# Patient Record
Sex: Female | Born: 1955
Health system: Southern US, Community
[De-identification: ages and names within clinical notes are randomized; demographics above are authoritative.]

## PROBLEM LIST (undated history)

## (undated) DIAGNOSIS — F419 Anxiety disorder, unspecified: Secondary | ICD-10-CM

## (undated) DIAGNOSIS — D649 Anemia, unspecified: Secondary | ICD-10-CM

## (undated) DIAGNOSIS — R51 Headache: Secondary | ICD-10-CM

## (undated) DIAGNOSIS — K219 Gastro-esophageal reflux disease without esophagitis: Secondary | ICD-10-CM

## (undated) DIAGNOSIS — E039 Hypothyroidism, unspecified: Secondary | ICD-10-CM

## (undated) DIAGNOSIS — F32A Depression, unspecified: Secondary | ICD-10-CM

## (undated) DIAGNOSIS — T7840XA Allergy, unspecified, initial encounter: Secondary | ICD-10-CM

## (undated) DIAGNOSIS — I499 Cardiac arrhythmia, unspecified: Secondary | ICD-10-CM

## (undated) DIAGNOSIS — F329 Major depressive disorder, single episode, unspecified: Secondary | ICD-10-CM

## (undated) DIAGNOSIS — Z8489 Family history of other specified conditions: Secondary | ICD-10-CM

## (undated) HISTORY — DX: Allergy, unspecified, initial encounter: T78.40XA

---

## 1999-11-09 ENCOUNTER — Encounter: Payer: Self-pay | Admitting: Obstetrics and Gynecology

## 1999-11-09 ENCOUNTER — Encounter: Admission: RE | Admit: 1999-11-09 | Discharge: 1999-11-09 | Payer: Self-pay | Admitting: Obstetrics and Gynecology

## 1999-12-18 ENCOUNTER — Other Ambulatory Visit: Admission: RE | Admit: 1999-12-18 | Discharge: 1999-12-18 | Payer: Self-pay | Admitting: Obstetrics and Gynecology

## 2001-10-14 ENCOUNTER — Other Ambulatory Visit: Admission: RE | Admit: 2001-10-14 | Discharge: 2001-10-14 | Payer: Self-pay | Admitting: Obstetrics and Gynecology

## 2002-01-25 ENCOUNTER — Encounter: Payer: Self-pay | Admitting: Obstetrics and Gynecology

## 2002-01-25 ENCOUNTER — Encounter: Admission: RE | Admit: 2002-01-25 | Discharge: 2002-01-25 | Payer: Self-pay | Admitting: Obstetrics and Gynecology

## 2003-07-04 ENCOUNTER — Other Ambulatory Visit: Admission: RE | Admit: 2003-07-04 | Discharge: 2003-07-04 | Payer: Self-pay | Admitting: Obstetrics and Gynecology

## 2003-07-11 ENCOUNTER — Encounter: Admission: RE | Admit: 2003-07-11 | Discharge: 2003-07-11 | Payer: Self-pay | Admitting: Obstetrics and Gynecology

## 2005-01-24 ENCOUNTER — Other Ambulatory Visit: Admission: RE | Admit: 2005-01-24 | Discharge: 2005-01-24 | Payer: Self-pay | Admitting: Obstetrics and Gynecology

## 2005-02-18 ENCOUNTER — Encounter: Admission: RE | Admit: 2005-02-18 | Discharge: 2005-02-18 | Payer: Self-pay | Admitting: Obstetrics and Gynecology

## 2006-01-16 ENCOUNTER — Other Ambulatory Visit: Admission: RE | Admit: 2006-01-16 | Discharge: 2006-01-16 | Payer: Self-pay | Admitting: Obstetrics and Gynecology

## 2006-05-20 ENCOUNTER — Encounter: Admission: RE | Admit: 2006-05-20 | Discharge: 2006-05-20 | Payer: Self-pay | Admitting: Obstetrics and Gynecology

## 2007-08-18 ENCOUNTER — Encounter: Admission: RE | Admit: 2007-08-18 | Discharge: 2007-08-18 | Payer: Self-pay | Admitting: Obstetrics and Gynecology

## 2009-03-22 ENCOUNTER — Encounter: Admission: RE | Admit: 2009-03-22 | Discharge: 2009-03-22 | Payer: Self-pay | Admitting: Obstetrics and Gynecology

## 2009-07-05 ENCOUNTER — Encounter: Payer: Self-pay | Admitting: Nurse Practitioner

## 2009-07-28 ENCOUNTER — Encounter (INDEPENDENT_AMBULATORY_CARE_PROVIDER_SITE_OTHER): Payer: Self-pay | Admitting: *Deleted

## 2009-08-07 ENCOUNTER — Ambulatory Visit: Payer: Self-pay | Admitting: Internal Medicine

## 2009-08-16 ENCOUNTER — Ambulatory Visit: Payer: Self-pay | Admitting: Internal Medicine

## 2009-08-16 HISTORY — PX: POLYPECTOMY: SHX149

## 2009-08-16 HISTORY — PX: COLONOSCOPY: SHX174

## 2009-08-18 ENCOUNTER — Encounter: Payer: Self-pay | Admitting: Internal Medicine

## 2009-08-21 ENCOUNTER — Telehealth: Payer: Self-pay | Admitting: Internal Medicine

## 2009-08-21 ENCOUNTER — Ambulatory Visit: Payer: Self-pay | Admitting: Gastroenterology

## 2009-08-21 DIAGNOSIS — R1031 Right lower quadrant pain: Secondary | ICD-10-CM | POA: Insufficient documentation

## 2009-08-21 DIAGNOSIS — Z9189 Other specified personal risk factors, not elsewhere classified: Secondary | ICD-10-CM | POA: Insufficient documentation

## 2009-08-21 DIAGNOSIS — R1084 Generalized abdominal pain: Secondary | ICD-10-CM | POA: Insufficient documentation

## 2009-08-22 ENCOUNTER — Telehealth: Payer: Self-pay | Admitting: Nurse Practitioner

## 2009-08-23 ENCOUNTER — Telehealth: Payer: Self-pay | Admitting: Nurse Practitioner

## 2009-08-25 ENCOUNTER — Telehealth: Payer: Self-pay | Admitting: Nurse Practitioner

## 2009-09-04 LAB — CONVERTED CEMR LAB
Basophils Absolute: 0 10*3/uL (ref 0.0–0.1)
Eosinophils Absolute: 0.1 10*3/uL (ref 0.0–0.7)
Lymphocytes Relative: 10 % — ABNORMAL LOW (ref 12.0–46.0)
MCHC: 34.2 g/dL (ref 30.0–36.0)
MCV: 81.7 fL (ref 78.0–100.0)
Monocytes Absolute: 0.5 10*3/uL (ref 0.1–1.0)
Neutrophils Relative %: 84.3 % — ABNORMAL HIGH (ref 43.0–77.0)
Platelets: 318 10*3/uL (ref 150.0–400.0)
RBC: 4.09 M/uL (ref 3.87–5.11)
RDW: 15.5 % — ABNORMAL HIGH (ref 11.5–14.6)

## 2009-09-06 ENCOUNTER — Ambulatory Visit: Payer: Self-pay | Admitting: Nurse Practitioner

## 2009-09-11 LAB — CONVERTED CEMR LAB
Basophils Relative: 1.1 % (ref 0.0–3.0)
Eosinophils Absolute: 0.1 10*3/uL (ref 0.0–0.7)
Ferritin: 6.2 ng/mL — ABNORMAL LOW (ref 10.0–291.0)
Folate: 7.1 ng/mL
Hemoglobin: 11.9 g/dL — ABNORMAL LOW (ref 12.0–15.0)
Iron: 60 ug/dL (ref 42–145)
Lymphocytes Relative: 20 % (ref 12.0–46.0)
MCHC: 33.8 g/dL (ref 30.0–36.0)
Monocytes Relative: 6 % (ref 3.0–12.0)
Neutro Abs: 3 10*3/uL (ref 1.4–7.7)
Neutrophils Relative %: 71.2 % (ref 43.0–77.0)
RBC: 4.3 M/uL (ref 3.87–5.11)
Transferrin: 327.1 mg/dL (ref 212.0–360.0)
WBC: 4.3 10*3/uL — ABNORMAL LOW (ref 4.5–10.5)

## 2009-10-05 ENCOUNTER — Telehealth: Payer: Self-pay | Admitting: Nurse Practitioner

## 2010-03-23 ENCOUNTER — Encounter
Admission: RE | Admit: 2010-03-23 | Discharge: 2010-03-23 | Payer: Self-pay | Source: Home / Self Care | Attending: Obstetrics and Gynecology | Admitting: Obstetrics and Gynecology

## 2010-04-22 ENCOUNTER — Encounter: Payer: Self-pay | Admitting: Obstetrics and Gynecology

## 2010-05-01 NOTE — Letter (Signed)
Summary: Patient Notice- Polyp Results  [facility]  [address] [place], [place] [address]   Phone: [phone]  Fax: [phone]        Aug 18, 2009 MRN: [id number]    Shelby Shaw [address] [place], [place]  [address]    Dear Shelby Shaw,  I am pleased to inform you that the colon polyp(s) removed during your recent colonoscopy was (were) found to be benign (no cancer detected) upon pathologic examination.  I recommend you have a repeat colonoscopy examination in 3 years to look for recurrent polyps, as having colon polyps increases your risk for having recurrent polyps or even colon cancer in the future.  Should you develop new or worsening symptoms of abdominal pain, bowel habit changes or bleeding from the rectum or bowels, please schedule an evaluation with either your primary care physician or with me.  Additional information/recommendations:  __ No further action with gastroenterology is needed at this time. Please      follow-up with your primary care physician for your other healthcare      needs.  _  Please call us if you are having persistent problems or have questions about your condition that have not been fully answered at this time.  Sincerely,  Hilarie Fredrickson MD  This letter has been electronically signed by your physician.  Appended Document: Patient Notice- Polyp Results letter mailed

## 2010-05-01 NOTE — Progress Notes (Signed)
Summary: Condition update  Phone Note Call from Patient Call back at Home Phone [phone] Call back at [phone] Cell   Caller: Patient Call For: Willette Cluster Summary of Call: Update: The pain in the right side is gone, no temp., still some pain in back Initial call taken by: Zackery Barefoot,  Aug 23, 2009 2:07 PM  Follow-up for Phone Call        The pt has no fever today, and no abd pain but does still have some slight pain in the rt back area.  I asked if she is taking the Hydrocodone and she is taking 1/2 the dose .  It seems to give her a headache.  I told her to try some Extrastrength Tylenol and I asked her to call me on Friday with a progress report on her back pain.  She thanked [place] for being so nice and caring. Follow-up by: Joselyn Glassman,  Aug 23, 2009 4:05 PM

## 2010-05-01 NOTE — Assessment & Plan Note (Signed)
Summary: abd. pain post colon-Dr.Perry pt./cl   History of Present Illness Visit Type: Initial Visit Primary GI MD: Yancey Flemings MD Primary Provider: Huel Cote, MD Chief Complaint: abdominal pain start 08/18/09 History of Present Illness:   Patient is a 55 year old female who had a screening colonoscopy with removal of a 15mm pedunculated rectal polyp via snare and monopolar cautery by Dr. Marina Goodell on 08/16/09.  Two days later she developed mid lower and RLQ pain radiating around to back. With the onset of pain she took a Tylenol #3 (which she keeps on hand for migraines), and applied a heating pad. Low grade fever yesterday and today but pain is improving (without pain medications).  No rectal bleeding.     GI Review of Systems    Reports abdominal pain.     Location of  Abdominal pain: lower abdomen.    Denies acid reflux, belching, bloating, chest pain, dysphagia with liquids, dysphagia with solids, heartburn, loss of appetite, nausea, vomiting, vomiting blood, weight loss, and  weight gain.        Denies anal fissure, black tarry stools, change in bowel habit, constipation, diarrhea, diverticulosis, fecal incontinence, heme positive stool, hemorrhoids, irritable bowel syndrome, jaundice, light color stool, liver problems, rectal bleeding, and  rectal pain. Preventive Screening-Counseling & Management  Alcohol-Tobacco     Smoking Status: never      Drug Use:  no.     Current Medications (verified): 1)  Sertraline Hcl 100 Mg Tabs (Sertraline Hcl) .... Once Daily 2)  Junel 1.5/30 1.5-30 Mg-Mcg Tabs (Norethindrone Acet-Ethinyl Est) .... Take As Directed 3)  Lunesta 3 Mg Tabs (Eszopiclone) .... At Bedtime 4)  Vitamin D 50000iu .... Take 1 Tablet Once Weekly 5)  Maxalt 10 Mg Tabs (Rizatriptan Benzoate) .... As Needed For Migraines 6)  Clonazepam 1 Mg Tabs (Clonazepam) .... Once Daily  Allergies (verified): No Known Drug Allergies  Past History:  Past Medical History: Anxiety  Disorder Chronic Headaches Depression GERD Colon polyp (tubulovillous) 5/11  Past Surgical History: Tonsillectomy  Family History: No FH of Colon Cancer: Family History of Colon Polyps:Father Family History of Heart Disease:Father  Rheumatoid Arthritis: Mother  Social History: Occupation: n/a Patient has never smoked.  Alcohol Use - no Daily Caffeine Use Illicit Drug Use - no Smoking Status:  never Drug Use:  no  Review of Systems       The patient complains of fever.  The patient denies allergy/sinus, anemia, anxiety-new, arthritis/joint pain, back pain, blood in urine, breast changes/lumps, change in vision, confusion, cough, coughing up blood, depression-new, fainting, fatigue, headaches-new, hearing problems, heart murmur, heart rhythm changes, itching, menstrual pain, muscle pains/cramps, night sweats, nosebleeds, pregnancy symptoms, shortness of breath, skin rash, sleeping problems, sore throat, swelling of feet/legs, swollen lymph glands, thirst - excessive , urination - excessive , urination changes/pain, urine leakage, vision changes, and voice change.    Vital Signs:  Patient profile:   55 year old female Height:      62.5 inches Weight:      154 pounds BMI:     27.82 Temp:     99.1 degrees F oral Pulse rate:   120 / minute Pulse rhythm:   regular BP sitting:   144 / 86  (left arm) Cuff size:   regular  Vitals Entered By: June McMurray CMA Duncan Dull) (Aug 21, 2009 2:13 PM)  Physical Exam  General:  Well developed, well nourished, no acute distress. Eyes:  Conjunctiva pink, no icterus.  Mouth:  No  oral lesions. Tongue moist.  Neck:  no obvious masses  Lungs:  Clear throughout to auscultation. Heart:  Tachycardiac at 120 Abdomen:  Abdomen soft, nondistended, mild mid lower abdominal tenderness. No obvious masses or hepatomegaly. Normal bowel sounds. Soft upper mid abdominal bruit.  Neurologic:  Alert and  oriented x4;  grossly normal neurologically. Psych:   Anxious, teary- eyed.   Impression & Recommendations:  Problem # 1:  RLQ PAIN (ICD-789.03) Assessment New Probably post-polypectomy syndrome with fever (low grade) and abdominal pain. Pain is improving, mild tenderness on exam. Clear liquid diet for next 2-3 days. Will obtain CBC, give course of Cipro and Flagyl and limited amount of Vicodin to take if needed.  Will call patient tomorrow for condition update.  Orders: TLB-CBC Platelet - w/Differential (85025-[id number])  Patient Instructions: 1)  We have sent two perscriptions for antibiotics to [facility]. [address] [employer], [employer]. 2)  We faxed a perscription for Hydrocodone to our pharmacy.  3)  Clear liquid brochure given, stay on clear liquids for 2-3 days. 4)  Please call us tomorrow and let us know how she is doing and if your are not feeling better we may order a CT scan. 5)  Copy sent to : Huel Cote, MD 6)  The medication list was reviewed and reconciled.  All changed / newly prescribed medications were explained.  A complete medication list was provided to the patient / caregiver.  Prescriptions: HYDROCODONE-ACETAMINOPHEN 5-325 MG TABS (HYDROCODONE-ACETAMINOPHEN) Take 1 tab every 6 hours as needed for pain  #20 x 0   Entered by:   Lowry Ram NCMA   Authorized by:   Willette Cluster NP   Signed by:   Lowry Ram NCMA on 08/21/2009   Method used:   Printed then faxed to ...       [facility]  [address] [id number] (retail)       [address]       [place], [place]  [address]       Ph: [phone] or [phone]       Fax: [phone]   RxID:   [id number] FLAGYL 500 MG TABS (METRONIDAZOLE) Take 1 tab 3 times daily x 7 days  #21 x 0   Entered by:   Lowry Ram NCMA   Authorized by:   Willette Cluster NP   Signed by:   Lowry Ram NCMA on 08/21/2009   Method used:   Electronically to        [facility]  [address] [id number] (retail)       [address]       [place], [place]  [address]       Ph: [phone] or [phone]       Fax: [phone]    RxID:   [id number] CIPRO 500 MG TABS (CIPROFLOXACIN HCL) Take 1 tab 3 times daily  #21 x 0   Entered by:   Lowry Ram NCMA   Authorized by:   Willette Cluster NP   Signed by:   Lowry Ram NCMA on 08/21/2009   Method used:   [facility]ronically to        [facility]  [address] [id number] ([facility]ail)       [address]       [place], [place]  [address]       Ph: [phone] or [phone]       Fax: [phone]   RxID:   [id number]   Appended Document: abd. pain post colon-Dr.Perry pt./cl I doubt that this is post polypectomy syndrome  based on the timing and location of the polypectomy. If her pain continues, consider urinalysis and CT scan.

## 2010-05-01 NOTE — Progress Notes (Signed)
Summary: speak to [facility]  Phone Note Call from Patient Call back at [phone]   Caller: Patient Call For: Paula// Marina Goodell Reason for Call: Talk to Nurse Summary of Call: Patient wants to speak to [facility] regarding an appt that was set up for her. Initial call taken by: Tawni Levy,  October 05, 2009 3:44 PM  Follow-up for Phone Call        Pt changed her appt with Dr. Marina Goodell for the office from 10-11-09 to 10-30-09 at 3:45PM.  Follow-up by: Joselyn Glassman,  October 05, 2009 4:33 PM

## 2010-05-01 NOTE — Progress Notes (Signed)
Summary: Condtion update  Phone Note Call from Patient Call back at Home Phone [phone] Call back at [phone]   Caller: Patient Call For: Willette Cluster Reason for Call: Talk to Nurse Summary of Call: Update:  Pt is feeling better....no pain or fever just a little nauseated Initial call taken by: Karna Christmas,  Aug 25, 2009 2:47 PM  Follow-up for Phone Call        The pt is doing much better.  She is having no pain, nausea or fever. She wanted to thank [place] for checking on her. Follow-up by: Joselyn Glassman,  August 30, 2009 11:26 AM  Additional Follow-up for Phone Call Additional follow up Details #1::        great Additional Follow-up by: Willette Cluster NP,  September 02, 2009 10:01 AM

## 2010-05-01 NOTE — Miscellaneous (Signed)
Summary: [facility] Previsit/prep  Clinical Lists Changes  Medications: Added new medication of MOVIPREP 100 GM  SOLR (PEG-KCL-NACL-NASULF-NA ASC-C) As per prep instructions. - Signed Rx of MOVIPREP 100 GM  SOLR (PEG-KCL-NACL-NASULF-NA ASC-C) As per prep instructions.;  #1 x 0;  Signed;  Entered by: Wyona Almas RN;  Authorized by: Hilarie Fredrickson MD;  Method used: Electronically to [facility]  [address] [id number] [address], [place], [place]  [address], Ph: [phone] or [phone], Fax: [phone] Observations: Added new observation of NKA: T (08/07/2009 15:48)    Prescriptions: MOVIPREP 100 GM  SOLR (PEG-KCL-NACL-NASULF-NA ASC-C) As per prep instructions.  #1 x 0   Entered by:   Wyona Almas RN   Authorized by:   Hilarie Fredrickson MD   Signed by:   Wyona Almas RN on 08/07/2009   Method used:   [facility]ronically [facility]        [facility]  [address] [id number]       [address]       [place], [place]  [address]       Ph: [phone] or [phone]       Fax: [phone]   RxID:   [id number]

## 2010-05-01 NOTE — Procedures (Signed)
Summary: Colonoscopy  Patient: [name] Note: All result statuses are Final unless otherwise noted.  Tests: (1) Colonoscopy (COL)   COL Colonoscopy           DONE     [facility]     [address]     [place], [place]  [address]           COLONOSCOPY PROCEDURE REPORT           PATIENT:  Shelby Shaw, Shelby Shaw  MR#:  [id number]     BIRTHDATE:  June 19, 1955, 54 yrs. old  GENDER:  female     ENDOSCOPIST:  Wilhemina Bonito. Eda Keys, MD     REF. BY:  Huel Cote, M.D.     PROCEDURE DATE:  08/16/2009     PROCEDURE:  Colonoscopy with snare polypectomy x 1     ASA CLASS:  Class II     INDICATIONS:  Routine Risk Screening     MEDICATIONS:   Fentanyl 100 mcg IV, Versed 10 mg IV, Benadryl 50     mg IV           DESCRIPTION OF PROCEDURE:   After the risks benefits and     alternatives of the procedure were thoroughly explained, informed     consent was obtained.  Digital rectal exam was performed and     revealed no abnormalities.   The LB [id number] endoscope     was introduced through the anus and advanced to the cecum, which     was identified by both the appendix and ileocecal valve, without     limitations.Time to cecum = 4:35 min. The quality of the prep was     excellent, using MoviPrep.  The instrument was then slowly     withdrawn (11:18 min.) as the colon was fully examined.     <<PROCEDUREIMAGES>>           FINDINGS:  A 15mm pedunculated polyp was found in the rectum.     Polyp was snared, then cauterized with monopolar cautery.     Retrieval was successful.   This was otherwise a normal     examination of the colon.   Retroflexed views in the rectum     revealed no abnormalities.    The scope was then withdrawn from     the patient and the procedure completed.           COMPLICATIONS:  None     ENDOSCOPIC IMPRESSION:     1) Pedunculated polyp in the rectum - removed     2) Otherwise normal examination           RECOMMENDATIONS:     1) Follow up colonoscopy in 3  years           ______________________________     Wilhemina Bonito. Eda Keys, MD           CC:  Geoffry Paradise, MD;Kathy Senaida Ores, MD;The Patient           n.     Rosalie DoctorWilhemina Bonito. Eda Keys at 08/16/2009 02:08 PM           Heron Sabins, [phone]  Note: An exclamation mark (!) indicates a result that was not dispersed into the flowsheet. Document Creation Date: 08/17/2009 5:07 PM _______________________________________________________________________  (1) Order result status: Final Collection or observation date-time: 08/16/2009 14:03 Requested date-time:  Receipt date-time:  Reported date-time:  Referring Physician:   Ordering Physician: Yancey Flemings  Montez Hageman [phone]) Specimen Source:  Source: Launa Grill Order Number: [id number] Lab site:   Appended Document: Colonoscopy recall     Procedures Next Due Date:    Colonoscopy: 07/2012

## 2010-05-01 NOTE — Progress Notes (Signed)
Summary: Triage / pain  Phone Note Call from Patient Call back at [phone]   Caller: Patient Call For: Dr. Marina Goodell Reason for Call: Talk to Nurse Summary of Call: Pt had a colonoscopy last week. Is having discomfort in lower right abd. area wrapping around to back. Initial call taken by: Karna Christmas,  Aug 21, 2009 8:40 AM  Follow-up for Phone Call        Had colon Wed.5/18 rectal polyp only removed on Friday night-08/18/09  at about 11 p.m she began having abd. in middle and lower right quadrants which wasalmost constant thru the night and pain level was a 9 so she used her Tylenol#3 which she takes for migraines and heating pad and was able to sleep.Since then in am it is better but then starts up again.Is having soft formed brown  b.m's and has not seen any blood.Temp. at h.s. last night was 99.6 but is down this am. No nausea or vomiting. Follow-up by: Teryl Lucy RN,  Aug 21, 2009 9:00 AM  Additional Follow-up for Phone Call Additional follow up Details #1::        not sure why she is having pain. would think it unlikely related to procedure based on hx. She should be seen by someone in the office today Additional Follow-up by: Hilarie Fredrickson MD,  Aug 21, 2009 9:24 AM    Additional Follow-up for Phone Call Additional follow up Details #2::    Given appt. with N.P.for today. Follow-up by: Teryl Lucy RN,  Aug 21, 2009 9:59 AM

## 2010-05-01 NOTE — Progress Notes (Signed)
  Phone Note Outgoing Call   Call placed by: Willette Cluster, NP-C Call placed to: Patient Summary of Call: Called patient for condition update. She is feeling better today than yesterday. Temp 99.0. She will call tomorrow for another condition update.    C

## 2010-05-01 NOTE — Letter (Signed)
Summary: [facility] Instructions  [facility]  [address] [place], [place] [address]   Phone: [phone]  Fax: [phone]       Shelby Shaw    December 11, 1955    MRN: [id number]        Procedure Day Dorna Bloom:  [facility]  08/16/09     Arrival Time:  12:30PM     Procedure Time:  1:30PM     Location of Procedure:                    _X _  [facility] (4th Floor)                        PREPARATION FOR COLONOSCOPY WITH MOVIPREP   Starting 5 days prior to your procedure 08/11/09 do not eat nuts, seeds, popcorn, corn, beans, peas,  salads, or any raw vegetables.  Do not take any fiber supplements (e.g. Metamucil, Citrucel, and Benefiber).  THE DAY BEFORE YOUR PROCEDURE         DATE: 08/15/09  DAY: TUESDAY  1.  Drink clear liquids the entire day-NO SOLID FOOD  2.  Do not drink anything colored red or purple.  Avoid juices with pulp.  No orange juice.  3.  Drink at least 64 oz. (8 glasses) of fluid/clear liquids during the day to prevent dehydration and help the prep work efficiently.  CLEAR LIQUIDS INCLUDE: Water Jello Ice Popsicles Tea (sugar ok, no milk/cream) Powdered fruit flavored drinks Coffee (sugar ok, no milk/cream) Gatorade Juice: apple, white grape, white cranberry  Lemonade Clear bullion, consomm, broth Carbonated beverages (any kind) Strained chicken noodle soup Hard Candy                             4.  In the morning, mix first dose of MoviPrep solution:    Empty 1 Pouch A and 1 Pouch B into the disposable container    Add lukewarm drinking water to the top line of the container. Mix to dissolve    Refrigerate (mixed solution should be used within 24 hrs)  5.  Begin drinking the prep at 5:00 p.m. The MoviPrep container is divided by 4 marks.   Every 15 minutes drink the solution down to the next mark (approximately 8 oz) until the full liter is complete.   6.  Follow completed prep with 16 oz of clear liquid of your choice  (Nothing red or purple).  Continue to drink clear liquids until bedtime.  7.  Before going to bed, mix second dose of MoviPrep solution:    Empty 1 Pouch A and 1 Pouch B into the disposable container    Add lukewarm drinking water to the top line of the container. Mix to dissolve    Refrigerate  THE DAY OF YOUR PROCEDURE      DATE: 08/16/09  DAY: WEDNESDAY  Beginning at 8:30AM (5 hours before procedure):         1. Every 15 minutes, drink the solution down to the next mark (approx 8 oz) until the full liter is complete.  2. Follow completed prep with 16 oz. of clear liquid of your choice.    3. You may drink clear liquids until 11:30AM (2 HOURS BEFORE PROCEDURE).   MEDICATION INSTRUCTIONS  Unless otherwise instructed, you should take regular prescription medications with a small sip of water   as early as possible the morning  of your procedure.        OTHER INSTRUCTIONS  You will need a responsible adult at least 55 years of age to accompany you and drive you home.   This person must remain in the waiting room during your procedure.  Wear loose fitting clothing that is easily removed.  Leave jewelry and other valuables at home.  However, you may wish to bring a book to read or  an iPod/MP3 player to listen to music as you wait for your procedure to start.  Remove all body piercing jewelry and leave at home.  Total time from sign-in until discharge is approximately 2-3 hours.  You should go home directly after your procedure and rest.  You can resume normal activities the  day after your procedure.  The day of your procedure you should not:   Drive   Make legal decisions   Operate machinery   Drink alcohol   Return to work  You will receive specific instructions about eating, activities and medications before you leave.    The above instructions have been reviewed and explained to me by   Wyona Almas RN  Aug 07, 2009 4:45 PM     I fully understand  and can verbalize these instructions _____________________________ Date _________

## 2012-07-13 ENCOUNTER — Encounter: Payer: Self-pay | Admitting: Internal Medicine

## 2013-02-09 ENCOUNTER — Other Ambulatory Visit: Payer: Self-pay

## 2013-02-09 DIAGNOSIS — Z1231 Encounter for screening mammogram for malignant neoplasm of breast: Secondary | ICD-10-CM

## 2013-03-16 ENCOUNTER — Ambulatory Visit: Payer: Self-pay

## 2013-03-16 ENCOUNTER — Ambulatory Visit
Admission: RE | Admit: 2013-03-16 | Discharge: 2013-03-16 | Disposition: A | Discharge status: Home / Self Care | Payer: BC Managed Care – PPO | Source: Ambulatory Visit

## 2013-03-16 DIAGNOSIS — Z1231 Encounter for screening mammogram for malignant neoplasm of breast: Secondary | ICD-10-CM

## 2013-08-31 ENCOUNTER — Encounter (HOSPITAL_COMMUNITY): Payer: Self-pay | Admitting: Pharmacy Technician

## 2013-09-07 NOTE — Patient Instructions (Addendum)
Your procedure is scheduled on: Thursday, September 09, 2013  Enter through the [employer] [facility] at: 7:30am  Pick up the phone at the desk and dial [phone].  Call this number if you have problems the morning of surgery: [phone].  Remember: Do NOT eat food: After midnight tonight Do NOT drink clear liquids after: After midnight tonight Take these medicines the morning of surgery with a SIP OF WATER: Omeprazole, Levothyroxine, Clonazepam if needed for anxiety  Do NOT wear jewelry (body piercing), metal hair clips/bobby pins, make-up, or nail polish. Do NOT wear lotions, powders, or perfumes.  You may wear deoderant. Do NOT shave for 48 hours prior to surgery. Do NOT bring valuables to the hospital. Contacts, dentures, or bridgework may not be worn into surgery. Leave suitcase in car.  After surgery it may be brought to your room.  For patients admitted to the hospital, checkout time is 11:00 AM the day of discharge.

## 2013-09-08 ENCOUNTER — Encounter (HOSPITAL_COMMUNITY): Payer: Self-pay

## 2013-09-08 ENCOUNTER — Encounter: Payer: Self-pay | Admitting: Internal Medicine

## 2013-09-08 ENCOUNTER — Encounter (HOSPITAL_COMMUNITY)
Admission: RE | Admit: 2013-09-08 | Discharge: 2013-09-08 | Disposition: A | Discharge status: Home / Self Care | Payer: BC Managed Care – PPO | Source: Ambulatory Visit | Attending: Obstetrics and Gynecology | Admitting: Obstetrics and Gynecology

## 2013-09-08 HISTORY — DX: Gastro-esophageal reflux disease without esophagitis: K21.9

## 2013-09-08 HISTORY — DX: Depression, unspecified: F32.A

## 2013-09-08 HISTORY — DX: Headache: R51

## 2013-09-08 HISTORY — DX: Anemia, unspecified: D64.9

## 2013-09-08 HISTORY — DX: Cardiac arrhythmia, unspecified: I49.9

## 2013-09-08 HISTORY — DX: Major depressive disorder, single episode, unspecified: F32.9

## 2013-09-08 HISTORY — DX: Family history of other specified conditions: Z84.89

## 2013-09-08 HISTORY — DX: Anxiety disorder, unspecified: F41.9

## 2013-09-08 HISTORY — DX: Hypothyroidism, unspecified: E03.9

## 2013-09-08 LAB — CBC
HCT: 41 % (ref 36.0–46.0)
HEMOGLOBIN: 13.5 g/dL (ref 12.0–15.0)
MCH: 29.1 pg (ref 26.0–34.0)
MCHC: 32.9 g/dL (ref 30.0–36.0)
MCV: 88.4 fL (ref 78.0–100.0)
Platelets: 321 10*3/uL (ref 150–400)
RBC: 4.64 MIL/uL (ref 3.87–5.11)
RDW: 12.8 % (ref 11.5–15.5)
WBC: 6 10*3/uL (ref 4.0–10.5)

## 2013-09-08 LAB — ABO/RH: ABO/RH(D): B POS

## 2013-09-08 LAB — BASIC METABOLIC PANEL
BUN: 9 mg/dL (ref 6–23)
CO2: 25 mEq/L (ref 19–32)
Calcium: 10.2 mg/dL (ref 8.4–10.5)
Chloride: 102 mEq/L (ref 96–112)
Creatinine, Ser: 0.67 mg/dL (ref 0.50–1.10)
GLUCOSE: 105 mg/dL — AB (ref 70–99)
POTASSIUM: 4.5 meq/L (ref 3.7–5.3)
SODIUM: 140 meq/L (ref 137–147)

## 2013-09-08 NOTE — H&P (Signed)
Shelby Shaw is an 58 y.o. female G2P2 who presents for scheduled LAVH/BSO for abnormal uterine bleeding and pelvic pain requiring narcotic relief.  The patient has a long h/o heavy cycles and was managed with OCP's for many years.  As she is into a menopausal age, she was switched from OCP's to cyclic HRT and has done poorly for 6 onths despite multiple adjustments to her regimen.  Her bleeding will alternate from constant spotting to flooding and severe cramping pain requiring narcotic relief. EMB was benign and US showed possible adenomyosis.  She also has significant headaches and depression with her hormone changes and wishes to come off them altogether.  Pertinent Gynecological History: Last pap: normal Date: 3/15 OB History:NSVD x 2   Menstrual History:  No LMP recorded.    Past Medical History  Diagnosis Date  . Hypothyroidism   . Dysrhythmia     "skipping beat" seems better since started Thyroid medication  . Anxiety   . Depression   . GERD (gastroesophageal reflux disease)   . Headache(784.0)     migraines  . Anemia   . Family history of anesthesia complication     mother has PONV this is patients first surgery    No past surgical history on file.  No family history on file.  Social History:  reports that she has never smoked. She has never used smokeless tobacco. She reports that she drinks alcohol. She reports that she does not use illicit drugs.  Allergies: No Known Allergies  No prescriptions prior to admission    ROS  There were no vitals taken for this visit. Physical Exam  Constitutional: She is oriented to person, place, and time. She appears well-developed and well-nourished.  Cardiovascular: Normal rate and regular rhythm.   Respiratory: Effort normal.  GI: Soft.  Genitourinary: Vagina normal and uterus normal.  Neurological: She is alert and oriented to person, place, and time.  Psychiatric: She has a normal mood and affect.    Results for orders  placed during the hospital encounter of 09/08/13 (from the past 24 hour(s))  ABO/RH     Status: None   Collection Time    09/08/13 11:55 AM      Result Value Ref Range   ABO/RH(D) B POS    BASIC METABOLIC PANEL     Status: Abnormal   Collection Time    09/08/13 11:55 AM      Result Value Ref Range   Sodium 140  137 - 147 mEq/L   Potassium 4.5  3.7 - 5.3 mEq/L   Chloride 102  96 - 112 mEq/L   CO2 25  19 - 32 mEq/L   Glucose, Bld 105 (*) 70 - 99 mg/dL   BUN 9  6 - 23 mg/dL   Creatinine, Ser 0.67  0.50 - 1.10 mg/dL   Calcium 10.2  8.4 - 10.5 mg/dL   GFR calc non Af Amer >90  >90 mL/min   GFR calc Af Amer >90  >90 mL/min  CBC     Status: None   Collection Time    09/08/13 11:55 AM      Result Value Ref Range   WBC 6.0  4.0 - 10.5 K/uL   RBC 4.64  3.87 - 5.11 MIL/uL   Hemoglobin 13.5  12.0 - 15.0 g/dL   HCT 41.0  36.0 - 46.0 %   MCV 88.4  78.0 - 100.0 fL   MCH 29.1  26.0 - 34.0 pg   MCHC 32.9  30.0 - 36.0 g/dL   RDW 12.8  11.5 - 15.5 %   Platelets 321  150 - 400 K/uL    No results found.  Assessment/Plan: The patient was counseled regarding the risks of laparoscopic assisted vaginal hysterectomy, The procedure was reviewed in detail and expectations regarding recovery. Risks of bleeding, infection and possible damage to bowel and bladder were reviewed. The patient understands that should a complication arise she would likely need a larger abdominal incision and that this would delay her recovery. She would accept a blood transfusion if needed. We also discussed removal of the fallopian tubes as a means of possibly reducing future risk of ovarian cancer and she is agreeable to this. She would also like her ovaries removed. We discussed that this may make hormonal symptoms worse. She is ready to proceed.    Logan Bores 09/08/2013, 10:14 PM

## 2013-09-09 ENCOUNTER — Encounter (HOSPITAL_COMMUNITY): Payer: BC Managed Care – PPO | Admitting: Certified Registered"

## 2013-09-09 ENCOUNTER — Ambulatory Visit (HOSPITAL_COMMUNITY): Payer: BC Managed Care – PPO | Admitting: Certified Registered"

## 2013-09-09 ENCOUNTER — Encounter (HOSPITAL_COMMUNITY): Payer: Self-pay

## 2013-09-09 ENCOUNTER — Encounter (HOSPITAL_COMMUNITY)
Admission: RE | Disposition: A | Discharge status: Home / Self Care | Payer: Self-pay | Source: Ambulatory Visit | Attending: Obstetrics and Gynecology

## 2013-09-09 ENCOUNTER — Ambulatory Visit (HOSPITAL_COMMUNITY)
Admission: RE | Admit: 2013-09-09 | Discharge: 2013-09-10 | Disposition: A | Discharge status: Home / Self Care | Payer: BC Managed Care – PPO | Source: Ambulatory Visit | Attending: Obstetrics and Gynecology | Admitting: Obstetrics and Gynecology

## 2013-09-09 DIAGNOSIS — K219 Gastro-esophageal reflux disease without esophagitis: Secondary | ICD-10-CM | POA: Insufficient documentation

## 2013-09-09 DIAGNOSIS — Z90710 Acquired absence of both cervix and uterus: Secondary | ICD-10-CM | POA: Diagnosis present

## 2013-09-09 DIAGNOSIS — Z79890 Hormone replacement therapy (postmenopausal): Secondary | ICD-10-CM | POA: Insufficient documentation

## 2013-09-09 DIAGNOSIS — N938 Other specified abnormal uterine and vaginal bleeding: Secondary | ICD-10-CM | POA: Insufficient documentation

## 2013-09-09 DIAGNOSIS — F341 Dysthymic disorder: Secondary | ICD-10-CM | POA: Insufficient documentation

## 2013-09-09 DIAGNOSIS — N8000 Endometriosis of the uterus, unspecified: Secondary | ICD-10-CM | POA: Insufficient documentation

## 2013-09-09 DIAGNOSIS — D649 Anemia, unspecified: Secondary | ICD-10-CM | POA: Insufficient documentation

## 2013-09-09 DIAGNOSIS — D251 Intramural leiomyoma of uterus: Secondary | ICD-10-CM | POA: Insufficient documentation

## 2013-09-09 DIAGNOSIS — E039 Hypothyroidism, unspecified: Secondary | ICD-10-CM | POA: Insufficient documentation

## 2013-09-09 DIAGNOSIS — N949 Unspecified condition associated with female genital organs and menstrual cycle: Secondary | ICD-10-CM | POA: Insufficient documentation

## 2013-09-09 HISTORY — PX: LAPAROSCOPIC ASSISTED VAGINAL HYSTERECTOMY: SHX5398

## 2013-09-09 LAB — PREGNANCY, URINE: Preg Test, Ur: NEGATIVE

## 2013-09-09 SURGERY — HYSTERECTOMY, VAGINAL, LAPAROSCOPY-ASSISTED
Anesthesia: General | Site: Abdomen | Laterality: Bilateral

## 2013-09-09 MED ORDER — SODIUM CHLORIDE 0.9 % IJ SOLN
INTRAMUSCULAR | Status: AC
  Filled 2013-09-09: qty 10

## 2013-09-09 MED ORDER — IBUPROFEN 600 MG PO TABS: 600.0000 mg | ORAL_TABLET | Freq: Four times a day (QID) | ORAL | Status: DC | PRN

## 2013-09-09 MED ORDER — ONDANSETRON HCL 4 MG/2ML IJ SOLN: 4.0000 mg | Freq: Four times a day (QID) | INTRAMUSCULAR | Status: DC | PRN

## 2013-09-09 MED ORDER — ROCURONIUM BROMIDE 100 MG/10ML IV SOLN
INTRAVENOUS | Status: AC
Start: 2013-09-09 — End: 2013-09-09
  Filled 2013-09-09: qty 1

## 2013-09-09 MED ORDER — GLYCOPYRROLATE 0.2 MG/ML IJ SOLN
INTRAMUSCULAR | Status: AC
  Filled 2013-09-09: qty 1

## 2013-09-09 MED ORDER — NEOSTIGMINE METHYLSULFATE 10 MG/10ML IV SOLN
INTRAVENOUS | Status: AC
  Filled 2013-09-09: qty 1

## 2013-09-09 MED ORDER — NEOSTIGMINE METHYLSULFATE 10 MG/10ML IV SOLN
INTRAVENOUS | Status: DC | PRN
  Administered 2013-09-09: 1 mg via INTRAVENOUS
  Administered 2013-09-09: 3 mg via INTRAVENOUS

## 2013-09-09 MED ORDER — CLONAZEPAM 0.5 MG PO TABS: 1.0000 mg | ORAL_TABLET | Freq: Every day | ORAL | Status: DC | PRN

## 2013-09-09 MED ORDER — SODIUM CHLORIDE 0.9 % IJ SOLN
INTRAMUSCULAR | Status: AC
  Filled 2013-09-09: qty 50

## 2013-09-09 MED ORDER — MENTHOL 3 MG MT LOZG: 1.0000 | LOZENGE | OROMUCOSAL | Status: DC | PRN

## 2013-09-09 MED ORDER — DIPHENHYDRAMINE HCL 12.5 MG/5ML PO ELIX: 12.5000 mg | ORAL_SOLUTION | Freq: Four times a day (QID) | ORAL | Status: DC | PRN

## 2013-09-09 MED ORDER — KETOROLAC TROMETHAMINE 30 MG/ML IJ SOLN
INTRAMUSCULAR | Status: DC | PRN
  Administered 2013-09-09: 30 mg via INTRAVENOUS

## 2013-09-09 MED ORDER — HEPARIN SODIUM (PORCINE) 5000 UNIT/ML IJ SOLN
INTRAMUSCULAR | Status: AC
  Filled 2013-09-09: qty 1

## 2013-09-09 MED ORDER — GLYCOPYRROLATE 0.2 MG/ML IJ SOLN
INTRAMUSCULAR | Status: DC | PRN
  Administered 2013-09-09: .6 mg via INTRAVENOUS
  Administered 2013-09-09: 0.2 mg via INTRAVENOUS

## 2013-09-09 MED ORDER — MIDAZOLAM HCL 2 MG/2ML IJ SOLN
INTRAMUSCULAR | Status: AC
  Filled 2013-09-09: qty 2

## 2013-09-09 MED ORDER — CEFAZOLIN SODIUM-DEXTROSE 2-3 GM-% IV SOLR
2.0000 g | INTRAVENOUS | Status: AC
  Administered 2013-09-09: 2 g via INTRAVENOUS

## 2013-09-09 MED ORDER — DOCUSATE SODIUM 100 MG PO CAPS
100.0000 mg | ORAL_CAPSULE | Freq: Two times a day (BID) | ORAL | Status: DC
  Administered 2013-09-09 – 2013-09-10 (×2): 100 mg via ORAL
  Filled 2013-09-09 (×2): qty 1

## 2013-09-09 MED ORDER — HYDROMORPHONE HCL PF 1 MG/ML IJ SOLN
INTRAMUSCULAR | Status: DC | PRN
  Administered 2013-09-09: 1 mg via INTRAVENOUS

## 2013-09-09 MED ORDER — ROCURONIUM BROMIDE 100 MG/10ML IV SOLN
INTRAVENOUS | Status: DC | PRN
  Administered 2013-09-09: 35 mg via INTRAVENOUS
  Administered 2013-09-09: 5 mg via INTRAVENOUS

## 2013-09-09 MED ORDER — LIDOCAINE HCL (CARDIAC) 20 MG/ML IV SOLN
INTRAVENOUS | Status: AC
  Filled 2013-09-09: qty 5

## 2013-09-09 MED ORDER — DIPHENHYDRAMINE HCL 50 MG/ML IJ SOLN: 12.5000 mg | Freq: Four times a day (QID) | INTRAMUSCULAR | Status: DC | PRN

## 2013-09-09 MED ORDER — RIZATRIPTAN BENZOATE 5 MG PO TABS
5.0000 mg | ORAL_TABLET | ORAL | Status: DC | PRN
  Administered 2013-09-09: 5 mg via ORAL

## 2013-09-09 MED ORDER — SIMETHICONE 80 MG PO CHEW: 80.0000 mg | CHEWABLE_TABLET | Freq: Four times a day (QID) | ORAL | Status: DC | PRN

## 2013-09-09 MED ORDER — DEXAMETHASONE SODIUM PHOSPHATE 10 MG/ML IJ SOLN
INTRAMUSCULAR | Status: AC
  Filled 2013-09-09: qty 1

## 2013-09-09 MED ORDER — NALOXONE HCL 0.4 MG/ML IJ SOLN: 0.4000 mg | INTRAMUSCULAR | Status: DC | PRN

## 2013-09-09 MED ORDER — SERTRALINE HCL 100 MG PO TABS
100.0000 mg | ORAL_TABLET | Freq: Every day | ORAL | Status: DC
  Filled 2013-09-09: qty 1

## 2013-09-09 MED ORDER — VASOPRESSIN 20 UNIT/ML IJ SOLN
INTRAVENOUS | Status: DC | PRN
  Administered 2013-09-09: 10:00:00 via INTRAMUSCULAR

## 2013-09-09 MED ORDER — PROPOFOL 10 MG/ML IV EMUL
INTRAVENOUS | Status: AC
  Filled 2013-09-09: qty 20

## 2013-09-09 MED ORDER — SUCCINYLCHOLINE CHLORIDE 20 MG/ML IJ SOLN
INTRAMUSCULAR | Status: AC
  Filled 2013-09-09: qty 20

## 2013-09-09 MED ORDER — SCOPOLAMINE 1 MG/3DAYS TD PT72
MEDICATED_PATCH | TRANSDERMAL | Status: AC
  Filled 2013-09-09: qty 1

## 2013-09-09 MED ORDER — FLUTICASONE PROPIONATE 50 MCG/ACT NA SUSP: 1.0000 | Freq: Every day | NASAL | Status: DC | PRN

## 2013-09-09 MED ORDER — OXYCODONE-ACETAMINOPHEN 5-325 MG PO TABS
1.0000 | ORAL_TABLET | ORAL | Status: DC | PRN
  Administered 2013-09-10: 2 via ORAL
  Filled 2013-09-09: qty 2

## 2013-09-09 MED ORDER — ONDANSETRON HCL 4 MG PO TABS: 4.0000 mg | ORAL_TABLET | Freq: Four times a day (QID) | ORAL | Status: DC | PRN

## 2013-09-09 MED ORDER — HYDROMORPHONE HCL PF 1 MG/ML IJ SOLN
INTRAMUSCULAR | Status: AC
  Administered 2013-09-09: 0.5 mg via INTRAVENOUS
  Filled 2013-09-09: qty 1

## 2013-09-09 MED ORDER — SCOPOLAMINE 1 MG/3DAYS TD PT72
MEDICATED_PATCH | TRANSDERMAL | Status: DC | PRN
  Administered 2013-09-09: 1 via TRANSDERMAL

## 2013-09-09 MED ORDER — PHENYLEPHRINE HCL 10 MG/ML IJ SOLN
INTRAMUSCULAR | Status: DC | PRN
  Administered 2013-09-09 (×2): 80 ug via INTRAVENOUS
  Administered 2013-09-09: 40 ug via INTRAVENOUS

## 2013-09-09 MED ORDER — KETOROLAC TROMETHAMINE 30 MG/ML IJ SOLN
INTRAMUSCULAR | Status: AC
  Filled 2013-09-09: qty 1

## 2013-09-09 MED ORDER — HYDROMORPHONE HCL PF 1 MG/ML IJ SOLN
INTRAMUSCULAR | Status: AC
  Filled 2013-09-09: qty 1

## 2013-09-09 MED ORDER — MEPERIDINE HCL 25 MG/ML IJ SOLN: 6.2500 mg | INTRAMUSCULAR | Status: DC | PRN

## 2013-09-09 MED ORDER — LORATADINE 10 MG PO TABS
10.0000 mg | ORAL_TABLET | Freq: Every day | ORAL | Status: DC
  Administered 2013-09-10: 10 mg via ORAL
  Filled 2013-09-09 (×2): qty 1

## 2013-09-09 MED ORDER — LACTATED RINGERS IV SOLN
INTRAVENOUS | Status: DC
  Administered 2013-09-09 (×2): via INTRAVENOUS

## 2013-09-09 MED ORDER — BUPIVACAINE HCL (PF) 0.25 % IJ SOLN
INTRAMUSCULAR | Status: AC
  Filled 2013-09-09: qty 30

## 2013-09-09 MED ORDER — HYDROMORPHONE HCL PF 1 MG/ML IJ SOLN
0.2500 mg | INTRAMUSCULAR | Status: DC | PRN
  Administered 2013-09-09 (×2): 0.5 mg via INTRAVENOUS

## 2013-09-09 MED ORDER — BUPIVACAINE HCL (PF) 0.25 % IJ SOLN
INTRAMUSCULAR | Status: DC | PRN
  Administered 2013-09-09: 10 mL

## 2013-09-09 MED ORDER — HYDROMORPHONE 0.3 MG/ML IV SOLN
INTRAVENOUS | Status: DC
  Administered 2013-09-09: 0.6 mg via INTRAVENOUS
  Administered 2013-09-09: 0.399 mg via INTRAVENOUS
  Administered 2013-09-09: 13:00:00 via INTRAVENOUS
  Administered 2013-09-09: 0.2 mg via INTRAVENOUS
  Administered 2013-09-10: 0.599 mg via INTRAVENOUS
  Administered 2013-09-10: 0.799 mg via INTRAVENOUS
  Filled 2013-09-09: qty 25

## 2013-09-09 MED ORDER — ESTRADIOL 0.1 MG/GM VA CREA
TOPICAL_CREAM | VAGINAL | Status: AC
  Filled 2013-09-09: qty 42.5

## 2013-09-09 MED ORDER — ALUM & MAG HYDROXIDE-SIMETH 200-200-20 MG/5ML PO SUSP: 30.0000 mL | ORAL | Status: DC | PRN

## 2013-09-09 MED ORDER — ONDANSETRON HCL 4 MG/2ML IJ SOLN
INTRAMUSCULAR | Status: AC
  Filled 2013-09-09: qty 2

## 2013-09-09 MED ORDER — FENTANYL CITRATE 0.05 MG/ML IJ SOLN
INTRAMUSCULAR | Status: AC
  Filled 2013-09-09: qty 5

## 2013-09-09 MED ORDER — METOCLOPRAMIDE HCL 5 MG/ML IJ SOLN: 10.0000 mg | Freq: Once | INTRAMUSCULAR | Status: DC | PRN

## 2013-09-09 MED ORDER — GLYCOPYRROLATE 0.2 MG/ML IJ SOLN
INTRAMUSCULAR | Status: AC
  Filled 2013-09-09: qty 3

## 2013-09-09 MED ORDER — VASOPRESSIN 20 UNIT/ML IJ SOLN
INTRAMUSCULAR | Status: AC
  Filled 2013-09-09: qty 1

## 2013-09-09 MED ORDER — DEXAMETHASONE SODIUM PHOSPHATE 10 MG/ML IJ SOLN
INTRAMUSCULAR | Status: DC | PRN
  Administered 2013-09-09: 10 mg via INTRAVENOUS

## 2013-09-09 MED ORDER — LIDOCAINE HCL (CARDIAC) 20 MG/ML IV SOLN
INTRAVENOUS | Status: DC | PRN
  Administered 2013-09-09: 80 mg via INTRAVENOUS

## 2013-09-09 MED ORDER — SODIUM CHLORIDE 0.9 % IJ SOLN: 9.0000 mL | INTRAMUSCULAR | Status: DC | PRN

## 2013-09-09 MED ORDER — PROPOFOL 10 MG/ML IV BOLUS
INTRAVENOUS | Status: DC | PRN
  Administered 2013-09-09: 40 mg via INTRAVENOUS
  Administered 2013-09-09: 160 mg via INTRAVENOUS

## 2013-09-09 MED ORDER — ONDANSETRON HCL 4 MG/2ML IJ SOLN
INTRAMUSCULAR | Status: DC | PRN
  Administered 2013-09-09: 4 mg via INTRAVENOUS

## 2013-09-09 MED ORDER — FENTANYL CITRATE 0.05 MG/ML IJ SOLN
INTRAMUSCULAR | Status: DC | PRN
  Administered 2013-09-09: 50 ug via INTRAVENOUS
  Administered 2013-09-09: 100 ug via INTRAVENOUS
  Administered 2013-09-09 (×2): 50 ug via INTRAVENOUS

## 2013-09-09 MED ORDER — LEVOTHYROXINE SODIUM 50 MCG PO TABS
50.0000 ug | ORAL_TABLET | Freq: Every day | ORAL | Status: DC
  Administered 2013-09-10: 50 ug via ORAL
  Filled 2013-09-09: qty 1

## 2013-09-09 MED ORDER — MIDAZOLAM HCL 2 MG/2ML IJ SOLN
INTRAMUSCULAR | Status: DC | PRN
  Administered 2013-09-09: 2 mg via INTRAVENOUS

## 2013-09-09 MED ORDER — SUMATRIPTAN SUCCINATE 50 MG PO TABS
50.0000 mg | ORAL_TABLET | ORAL | Status: DC | PRN
  Filled 2013-09-09: qty 1

## 2013-09-09 MED ORDER — LACTATED RINGERS IV SOLN
INTRAVENOUS | Status: DC
  Administered 2013-09-09 – 2013-09-10 (×3): via INTRAVENOUS

## 2013-09-09 SURGICAL SUPPLY — 40 items
CABLE HIGH FREQUENCY MONO STRZ (ELECTRODE) IMPLANT
CATH ROBINSON RED A/P 16FR (CATHETERS) IMPLANT
CLOTH BEACON ORANGE TIMEOUT ST (SAFETY) ×2 IMPLANT
CONT PATH 16OZ SNAP LID 3702 (MISCELLANEOUS) ×2 IMPLANT
COVER MAYO STAND STRL (DRAPES) IMPLANT
COVER TABLE BACK 60X90 (DRAPES) ×2 IMPLANT
DECANTER SPIKE VIAL GLASS SM (MISCELLANEOUS) ×2 IMPLANT
DRSG COVADERM PLUS 2X2 (GAUZE/BANDAGES/DRESSINGS) ×4 IMPLANT
DRSG OPSITE POSTOP 3X4 (GAUZE/BANDAGES/DRESSINGS) IMPLANT
DURAPREP 26ML APPLICATOR (WOUND CARE) ×2 IMPLANT
ELECT REM PT RETURN 9FT ADLT (ELECTROSURGICAL) ×2
ELECTRODE REM PT RTRN 9FT ADLT (ELECTROSURGICAL) ×1 IMPLANT
FILTER SMOKE EVAC LAPAROSHD (FILTER) IMPLANT
GLOVE BIO SURGEON STRL SZ 6.5 (GLOVE) ×6 IMPLANT
GLOVE BIOGEL PI IND STRL 6.5 (GLOVE) ×1 IMPLANT
GLOVE BIOGEL PI IND STRL 7.0 (GLOVE) ×2 IMPLANT
GLOVE BIOGEL PI INDICATOR 6.5 (GLOVE) ×1
GLOVE BIOGEL PI INDICATOR 7.0 (GLOVE) ×2
GOWN STRL REUS W/ TWL LRG LVL3 (GOWN DISPOSABLE) ×7 IMPLANT
GOWN STRL REUS W/TWL LRG LVL3 (GOWN DISPOSABLE) ×7
NEEDLE INSUFFLATION 120MM (ENDOMECHANICALS) ×2 IMPLANT
NS IRRIG 1000ML POUR BTL (IV SOLUTION) ×2 IMPLANT
PACK LAVH (CUSTOM PROCEDURE TRAY) ×2 IMPLANT
PROTECTOR NERVE ULNAR (MISCELLANEOUS) ×2 IMPLANT
SET IRRIG TUBING LAPAROSCOPIC (IRRIGATION / IRRIGATOR) ×2 IMPLANT
SHEARS HARMONIC ACE PLUS 36CM (ENDOMECHANICALS) ×2 IMPLANT
STRIP CLOSURE SKIN 1/4X3 (GAUZE/BANDAGES/DRESSINGS) IMPLANT
SUT SILK 0 FSL (SUTURE) IMPLANT
SUT VIC AB 0 CT1 18XCR BRD8 (SUTURE) ×2 IMPLANT
SUT VIC AB 0 CT1 8-18 (SUTURE) ×2
SUT VIC AB 2-0 CT1 (SUTURE) ×2 IMPLANT
SUT VICRYL 0 TIES 12 18 (SUTURE) IMPLANT
SUT VICRYL 0 UR6 27IN ABS (SUTURE) ×2 IMPLANT
SUT VICRYL 4-0 PS2 18IN ABS (SUTURE) ×2 IMPLANT
TOWEL OR 17X24 6PK STRL BLUE (TOWEL DISPOSABLE) ×4 IMPLANT
TRAY FOLEY CATH 14FR (SET/KITS/TRAYS/PACK) ×2 IMPLANT
TROCAR XCEL NON-BLD 5MMX100MML (ENDOMECHANICALS) ×2 IMPLANT
TROCAR XCEL OPT SLVE 5M 100M (ENDOMECHANICALS) ×4 IMPLANT
WARMER LAPAROSCOPE (MISCELLANEOUS) ×2 IMPLANT
WATER STERILE IRR 1000ML POUR (IV SOLUTION) IMPLANT

## 2013-09-09 NOTE — Addendum Note (Signed)
Addendum created 09/09/13 1233 by Venia Carbon. Royce Macadamia, MD   Modules edited: Orders

## 2013-09-09 NOTE — Addendum Note (Signed)
Addendum created 09/09/13 1753 by Arcelia Jew, CRNA   Modules edited: Notes Section   Notes Section:  File: [id number]

## 2013-09-09 NOTE — Transfer of Care (Signed)
Immediate Anesthesia Transfer of Care Note  Patient: Shelby Shaw  Procedure(s) Performed: Procedure(s): LAPAROSCOPIC ASSISTED VAGINAL HYSTERECTOMY, Bilateral Salpingo-oophorectomy (Bilateral)  Patient Location: PACU  Anesthesia Type:General  Level of Consciousness: awake, alert  and oriented  Airway & Oxygen Therapy: Patient Spontanous Breathing and Patient connected to nasal cannula oxygen  Post-op Assessment: Report given to PACU RN, Post -op Vital signs reviewed and stable and Patient moving all extremities  Post vital signs: Reviewed and stable  Complications: No apparent anesthesia complications

## 2013-09-09 NOTE — Progress Notes (Signed)
Patient ID: Shelby Shaw, female   DOB: 1955/09/27, 58 y.o.   MRN: [id number] DOS Pt is alert and awake Output good no complaints

## 2013-09-09 NOTE — Anesthesia Postprocedure Evaluation (Signed)
  Anesthesia Post-op Note  Patient: Shelby Shaw  Procedure(s) Performed: Procedure(s): LAPAROSCOPIC ASSISTED VAGINAL HYSTERECTOMY, Bilateral Salpingo-oophorectomy (Bilateral)  Patient Location: PACU  Anesthesia Type:General   Level of Consciousness: awake, alert  and oriented  Airway and Oxygen Therapy: Patient Spontanous Breathing  Post-op Pain: none  Post-op Assessment: Post-op Vital signs reviewed, Patient's Cardiovascular Status Stable, Respiratory Function Stable, Patent Airway, No signs of Nausea or vomiting and Pain level controlled  Post-op Vital Signs: Reviewed and stable  Last Vitals:  Filed Vitals:   09/09/13 1145  BP: 146/79  Pulse: 98  Temp:   Resp: 16    Complications: No apparent anesthesia complications

## 2013-09-09 NOTE — Anesthesia Preprocedure Evaluation (Signed)
Anesthesia Evaluation  Patient identified by MRN, date of birth, ID band Patient awake    Reviewed: Allergy & Precautions, H&P , NPO status , Patient's Chart, lab work & pertinent test results  Airway Mallampati: I TM Distance: >3 FB Neck ROM: Full    Dental no notable dental hx. (+) Teeth Intact   Pulmonary neg pulmonary ROS,  breath sounds clear to auscultation  Pulmonary exam normal       Cardiovascular negative cardio ROS  + dysrhythmias Rhythm:Regular Rate:Normal     Neuro/Psych  Headaches, PSYCHIATRIC DISORDERS Anxiety Depression    GI/Hepatic Neg liver ROS, GERD-  Medicated and Controlled,  Endo/Other  Hypothyroidism   Renal/GU negative Renal ROS  negative genitourinary   Musculoskeletal negative musculoskeletal ROS (+)   Abdominal   Peds  Hematology  (+) anemia ,   Anesthesia Other Findings   Reproductive/Obstetrics AUB Pelvic pain                           Anesthesia Physical Anesthesia Plan  ASA: II  Anesthesia Plan: General   Post-op Pain Management:    Induction: Intravenous  Airway Management Planned: Oral ETT  Additional Equipment:   Intra-op Plan:   Post-operative Plan: Extubation in OR  Informed Consent: I have reviewed the patients History and Physical, chart, labs and discussed the procedure including the risks, benefits and alternatives for the proposed anesthesia with the patient or authorized representative who has indicated his/her understanding and acceptance.   Dental advisory given  Plan Discussed with: CRNA, Anesthesiologist and Surgeon  Anesthesia Plan Comments:         Anesthesia Quick Evaluation

## 2013-09-09 NOTE — Anesthesia Postprocedure Evaluation (Signed)
  Anesthesia Post-op Note  Patient: Shelby Shaw  Procedure(s) Performed: Procedure(s): LAPAROSCOPIC ASSISTED VAGINAL HYSTERECTOMY, Bilateral Salpingo-oophorectomy (Bilateral)  Patient Location: [facility]  Anesthesia Type:General  Level of Consciousness: awake, alert  and oriented  Airway and Oxygen Therapy: Patient Spontanous Breathing  Post-op Pain: none  Post-op Assessment: Post-op Vital signs reviewed and Patient's Cardiovascular Status Stable  Post-op Vital Signs: Reviewed and stable  Last Vitals:  Filed Vitals:   09/09/13 1712  BP:   Pulse:   Temp:   Resp: 18    Complications: No apparent anesthesia complications

## 2013-09-09 NOTE — Op Note (Signed)
Operative note  Preoperative diagnosis Abnormal uterine bleeding Pelvic pain  Postoperative diagnosis Same  Procedure Laparoscopic assisted vaginal hysterectomy with bilateral salpingo-oophorectomy  Surgeon Dr. Paula Compton Dr. Sherren Mocha Meisinger  Anesthesia Gen.  Fluids Estimated blood loss 125 cc Urine output 250 cc clear IV fluid 200 cc LR  Findings The uterus appeared normal in size the tubes and ovaries appeared normal. There is no obvious  evidence of endometriosis.  The liver and gallbladder appeared normal.  Specimen Uterus,cervix, tubes, and ovaries sent to pathology  Procedure note Patient was taken to the operating room where general anesthesia was obtained without difficulty. She was then prepped and draped in normal sterile fashion in the dorsal lithotomy position. An appropriate time out was performed. A speculum was placed within the vagina and the cervix identified and a Hulka tenaculum placed within it for uterine manipulation. Gloves and gown were changed and attention was then turned to the patient's abdomen where a small infraumbilical incision was then made with the scalpel after injection with quarter percent Marcaine. The varies needle was then introduced into the peritoneal cavity and intraperitoneal placement confirmed with aspiration injection with normal saline. Gas flow was applied and pneumoperitoneum obtained with approximately 2 and half liters of CO2 gas and a normal pressure of 7 was noted. The varies needle was then removed and the 5 mm camera was utilized with the Optiview trocar with direct visual entry into the peritoneal cavity. Once this was completed the pelvis was carefully inspected and the findings as previously stated noted. 2 additional ports were then placed in the upper lateral quadrants both 5 mm in size and each the port site was injected with quarter percent Marcaine for placement. With all ports in place the right fallopian tube and  ovary were reflected medially and the harmonic scalpel utilized to take down the infundibulopelvic ligaments the broad ligament down to the level of the uterine cornua. The remainder of the broad ligament and round ligament were then also taken down with harmonic scalpel and the bladder flap developed with the harmonic as well. In a similar fashion the left ovary and fallopian tube were reflected medially and the harmonic utilized to transect the  Infundibulopelvic,  broad ligament and round ligament. The bladder flap was developed as well to the midline and at this point all pedicles appeared hemostatic.  All instruments were then removed from the ports and they were covered with a sterile drape. Attention was then turned vaginally where a weighted speculum was placed within the vagina and the cervix grasped with Yates Decamp tenaculums x2.  The cervix was injected with a dilute solution of Pitressin and the Bovie utilized to make a circumferential incision around the cervix itself. The vaginal mucosa was then trimmed away from the underlying cervix with Mayo scissors and the anterior cul-de-sac was dissected and entered sharply. In a similar fashion the posterior aspect was dissected and the posterior cul-de-sac entered sharply. A banana speculum was placed within the posterior cul-de-sac and a Deaver retractor in the anterior cul-de-sac. With the bladder and the rectum then isolated the uterosacral ligaments were clamped a Zeppelin clamp transected and suture ligated with 0 Vicryl. These were held in a hemostat bilaterally. 2 additional bites were taken in the paracervical tissue and each step was clamped with Zeppelin clamp and suture-ligated with 0 Vicryl and transected. The uterus was then noted to be free of its pedicles and was handed off with the tubes and ovaries attached. A small area of  bleeding along the left angle was controlled with a suture ligature of 0 Vicryl. The banana speculum was then removed and  replaced with the short weighted speculum. The vaginal cuff was then run in a running locked baseball suture of 2-0 Vicryl. The uterosacral ligaments were then approximated to one another and the vaginal cuff with a 0 Vicryl suture ligature. The remainder of the instruments were removed from the abdomen and the vaginal cuff was then closed in a running locked fashion with 2-0 Vicryl. It appeared hemostatic at the conclusion of the closure. Gloves and gown the were again changed and attention was returned to the abdomen where pneumoperitoneum was once again restored. The pelvis and abdomen were inspected and found to be hemostatic with all pedicles appearing normal. The vaginal cuff was also dry. Therefore the lateral trochars were removed under direct visualization and no bleeding noted. The pneumoperitoneum was then reduced through the umbilical trocar and that port also removed. The port sites were closed with 3-0 Vicryl in a subcuticular stitch and Dermabond and one deep suture of 0 Vicryl at the umbilical port site.  All instruments and sponge counts were again correct and the patient was taken to the recovery room in good condition

## 2013-09-09 NOTE — Progress Notes (Signed)
Patient ID: Shelby Shaw, female   DOB: 03-13-1956, 58 y.o.   MRN: [id number] PEr pt no changes in dictated H&P.  Brief exam WNL.

## 2013-09-10 ENCOUNTER — Encounter (HOSPITAL_COMMUNITY): Payer: Self-pay | Admitting: Obstetrics and Gynecology

## 2013-09-10 LAB — CBC
HCT: 36.4 % (ref 36.0–46.0)
Hemoglobin: 12 g/dL (ref 12.0–15.0)
MCH: 29 pg (ref 26.0–34.0)
MCHC: 33 g/dL (ref 30.0–36.0)
MCV: 87.9 fL (ref 78.0–100.0)
PLATELETS: 283 10*3/uL (ref 150–400)
RBC: 4.14 MIL/uL (ref 3.87–5.11)
RDW: 12.6 % (ref 11.5–15.5)
WBC: 9.9 10*3/uL (ref 4.0–10.5)

## 2013-09-10 LAB — BASIC METABOLIC PANEL
BUN: 8 mg/dL (ref 6–23)
CHLORIDE: 102 meq/L (ref 96–112)
CO2: 26 mEq/L (ref 19–32)
Calcium: 9.9 mg/dL (ref 8.4–10.5)
Creatinine, Ser: 0.69 mg/dL (ref 0.50–1.10)
GFR calc non Af Amer: >90 mL/min (ref >90)
Glucose, Bld: 133 mg/dL — ABNORMAL HIGH (ref 70–99)
Potassium: 4.2 mEq/L (ref 3.7–5.3)
Sodium: 141 mEq/L (ref 137–147)

## 2013-09-10 MED ORDER — IBUPROFEN 600 MG PO TABS: 600.0000 mg | ORAL_TABLET | Freq: Four times a day (QID) | ORAL | Status: DC | PRN

## 2013-09-10 MED ORDER — OXYCODONE-ACETAMINOPHEN 5-325 MG PO TABS: 1.0000 | ORAL_TABLET | ORAL | Status: DC | PRN

## 2013-09-10 NOTE — Progress Notes (Signed)
Ptd/c home medicated prior to d/c

## 2013-09-10 NOTE — Discharge Summary (Signed)
Physician Discharge Summary  Patient ID: Shelby Shaw MRN: [id number] DOB/AGE: July 12, 1955 58 y.o.  Admit date: 09/09/2013 Discharge date: 09/10/2013  Admission Diagnoses: Abnormal uterine bleeding                                         Pelvic pain  Discharge Diagnoses:  Active Problems:   S/P laparoscopic assisted vaginal hysterectomy (LAVH) with BSO  Discharged Condition: good  Hospital Course: Pt stayed overnight for post-operative care.  By ost-operative day #1 she was ambulating and tolerating a regular diet.  She was transitioned to po pain medications and her foley discontinued prior to d/c with no problems  Consults: None   Treatments: surgery: LAVH/BSO  Discharge Exam: Blood pressure 132/77, pulse 89, temperature 98.5 F (36.9 C), temperature source Oral, resp. rate 20, height 5\' 3"  (1.6 m), weight 73.936 kg (163 lb), SpO2 94.00%. General appearance: alert and cooperative Abdomen soft and incisions well-approximated  Disposition: Final discharge disposition not confirmed  Discharge Instructions   Diet - low sodium heart healthy    Complete by:  As directed      Discharge instructions    Complete by:  As directed   Avoid driving for at least 1-2 weeks or until off narcotic pain meds.  No heavy lifting greater than 10 lbs.  Nothing in vagina for 6 weeks.  May remove bandage in 1-2 days.  Shower over incision and pat dry     Increase activity slowly    Complete by:  As directed             Medication List    STOP taking these medications       acetaminophen-codeine 300-30 MG per tablet  Commonly known as:  TYLENOL #3      TAKE these medications       cetirizine 10 MG tablet  Commonly known as:  ZYRTEC  Take 10 mg by mouth daily as needed for allergies.     clonazePAM 1 MG tablet  Commonly known as:  KLONOPIN  Take 1-2 mg by mouth daily as needed for anxiety.     Fish Oil 1000 MG Caps  Take 2,000 mg by mouth daily.     fluticasone 50 MCG/ACT nasal  spray  Commonly known as:  FLONASE  Place 1 spray into both nostrils daily as needed for allergies or rhinitis.     ibuprofen 600 MG tablet  Commonly known as:  ADVIL,MOTRIN  Take 1 tablet (600 mg total) by mouth every 6 (six) hours as needed (mild pain).     levothyroxine 50 MCG tablet  Commonly known as:  SYNTHROID, LEVOTHROID  Take 50 mcg by mouth daily before breakfast.     omeprazole 20 MG capsule  Commonly known as:  PRILOSEC  Take 20 mg by mouth every evening.     oxyCODONE-acetaminophen 5-325 MG per tablet  Commonly known as:  PERCOCET/ROXICET  Take 1-2 tablets by mouth every 4 (four) hours as needed for severe pain (moderate to severe pain (when tolerating fluids)).     rizatriptan 5 MG tablet  Commonly known as:  MAXALT  Take 5 mg by mouth as needed for migraine. May repeat in 2 hours if needed     sertraline 100 MG tablet  Commonly known as:  ZOLOFT  Take 100 mg by mouth at bedtime.     VITAMIN D PO  Take 2  tablets by mouth daily.           Follow-up Information   Follow up with Shelby Bores, MD In 5 weeks. (Call to make appointment for postoperative exam in 5-6 weeks)    Specialty:  Obstetrics and Gynecology   Contact information:   [address] [place] [address] [phone]       Signed: Logan Shaw 09/10/2013, 8:40 AM

## 2013-09-10 NOTE — Progress Notes (Signed)
1 Day Post-Op Procedure(s) (LRB): LAPAROSCOPIC ASSISTED VAGINAL HYSTERECTOMY, Bilateral Salpingo-oophorectomy (Bilateral)  Subjective: Patient reports tolerating PO and + flatus.  Ambulated some.  Objective: I have reviewed patient's vital signs, intake and output and labs.  General: alert and cooperative Abdomen soft and NT Incisions clear and well-approximated  Assessment: s/p Procedure(s): LAPAROSCOPIC ASSISTED VAGINAL HYSTERECTOMY, Bilateral Salpingo-oophorectomy (Bilateral): progressing well  Plan: Discharge home this PM if tolerating po meds  LOS: 1 day    Shelby Shaw W 09/10/2013, 8:37 AM

## 2014-11-01 ENCOUNTER — Encounter: Payer: Self-pay | Admitting: Internal Medicine

## 2015-01-10 ENCOUNTER — Encounter: Payer: Self-pay | Admitting: Internal Medicine

## 2015-03-01 ENCOUNTER — Other Ambulatory Visit (HOSPITAL_COMMUNITY): Payer: Self-pay | Admitting: Psychiatry

## 2015-03-06 ENCOUNTER — Ambulatory Visit (AMBULATORY_SURGERY_CENTER): Payer: Self-pay | Admitting: *Deleted

## 2015-03-06 VITALS — Ht 63.0 in | Wt 170.4 lb

## 2015-03-06 DIAGNOSIS — Z86010 Personal history of colonic polyps: Secondary | ICD-10-CM

## 2015-03-06 MED ORDER — NA SULFATE-K SULFATE-MG SULF 17.5-3.13-1.6 GM/177ML PO SOLN: 1.0000 | Freq: Once | ORAL | Status: DC

## 2015-03-06 NOTE — Progress Notes (Signed)
No egg or soy allergy known to patient  No issues with past sedation with any surgeries  or procedures, no intubation problems  No diet pills No home 02 use per patient   emmi declined  bcbs blue select will not cover suprep. Sample given Medication Samples have been provided to the patient.  Drug name: suprep  Qty: 1  LOTIS:[id number]  Exp.Date: 19-18  The patient has been instructed regarding the correct time, dose, and frequency of taking this medication, including desired effects and most common side effects.   Hetty Ely Widener 1:16 PM 03/06/2015

## 2015-03-10 ENCOUNTER — Telehealth: Payer: Self-pay | Admitting: Internal Medicine

## 2015-03-10 NOTE — Telephone Encounter (Signed)
Pt states she is sick, has a sore throat and upper resp symptoms. States she has called her PCP for an antibiotic but wanted to know if that would cause problems with her procedure. Discussed with pt that as long as she did not have a fever when the procedure is supposed to be done she should be fine.

## 2015-03-15 ENCOUNTER — Ambulatory Visit (AMBULATORY_SURGERY_CENTER): Payer: BLUE CROSS/BLUE SHIELD | Admitting: Internal Medicine

## 2015-03-15 ENCOUNTER — Encounter: Payer: Self-pay | Admitting: Internal Medicine

## 2015-03-15 VITALS — BP 153/90 | HR 80 | Temp 98.3°F | Resp 26 | Ht 62.0 in | Wt 170.0 lb

## 2015-03-15 DIAGNOSIS — Z860100 Personal history of colon polyps, unspecified: Secondary | ICD-10-CM

## 2015-03-15 DIAGNOSIS — D123 Benign neoplasm of transverse colon: Secondary | ICD-10-CM | POA: Diagnosis not present

## 2015-03-15 DIAGNOSIS — D125 Benign neoplasm of sigmoid colon: Secondary | ICD-10-CM

## 2015-03-15 DIAGNOSIS — K635 Polyp of colon: Secondary | ICD-10-CM | POA: Diagnosis not present

## 2015-03-15 MED ORDER — SODIUM CHLORIDE 0.9 % IV SOLN: 500.0000 mL | INTRAVENOUS | Status: DC

## 2015-03-15 NOTE — Progress Notes (Signed)
Called to room to assist during endoscopic procedure.  Patient ID and intended procedure confirmed with present staff. Received instructions for my participation in the procedure from the performing physician.  

## 2015-03-15 NOTE — Progress Notes (Signed)
Report to PACU, RN, vss, BBS= Clear.  

## 2015-03-15 NOTE — Patient Instructions (Signed)
Discharge instructions given. Handout on polyps. Resume previous medications. YOU HAD AN ENDOSCOPIC PROCEDURE TODAY AT THE [facility]:   Refer to the procedure report that was given to you for any specific questions about what was found during the examination.  If the procedure report does not answer your questions, please call your gastroenterologist to clarify.  If you requested that your care partner not be given the details of your procedure findings, then the procedure report has been included in a sealed envelope for you to review at your convenience later.  YOU SHOULD EXPECT: Some feelings of bloating in the abdomen. Passage of more gas than usual.  Walking can help get rid of the air that was put into your GI tract during the procedure and reduce the bloating. If you had a lower endoscopy (such as a colonoscopy or flexible sigmoidoscopy) you may notice spotting of blood in your stool or on the toilet paper. If you underwent a bowel prep for your procedure, you may not have a normal bowel movement for a few days.  Please Note:  You might notice some irritation and congestion in your nose or some drainage.  This is from the oxygen used during your procedure.  There is no need for concern and it should clear up in a day or so.  SYMPTOMS TO REPORT IMMEDIATELY:   Following lower endoscopy (colonoscopy or flexible sigmoidoscopy):  Excessive amounts of blood in the stool  Significant tenderness or worsening of abdominal pains  Swelling of the abdomen that is new, acute  Fever of 100F or higher   For urgent or emergent issues, a gastroenterologist can be reached at any hour by calling [phone].   DIET: Your first meal following the procedure should be a small meal and then it is ok to progress to your normal diet. Heavy or fried foods are harder to digest and may make you feel nauseous or bloated.  Likewise, meals heavy in dairy and vegetables can increase bloating.  Drink  plenty of fluids but you should avoid alcoholic beverages for 24 hours.  ACTIVITY:  You should plan to take it easy for the rest of today and you should NOT DRIVE or use heavy machinery until tomorrow (because of the sedation medicines used during the test).    FOLLOW UP: Our staff will call the number listed on your records the next business day following your procedure to check on you and address any questions or concerns that you may have regarding the information given to you following your procedure. If we do not reach you, we will leave a message.  However, if you are feeling well and you are not experiencing any problems, there is no need to return our call.  We will assume that you have returned to your regular daily activities without incident.  If any biopsies were taken you will be contacted by phone or by letter within the next 1-3 weeks.  Please call us at [phone] if you have not heard about the biopsies in 3 weeks.    SIGNATURES/CONFIDENTIALITY: You and/or your care partner have signed paperwork which will be entered into your electronic medical record.  These signatures attest to the fact that that the information above on your After Visit Summary has been reviewed and is understood.  Full responsibility of the confidentiality of this discharge information lies with you and/or your care-partner. 

## 2015-03-16 ENCOUNTER — Telehealth: Payer: Self-pay | Admitting: *Deleted

## 2015-03-16 NOTE — Telephone Encounter (Signed)
  Follow up Call-  Call back number 03/15/2015  Post procedure Call Back phone  # [phone]  Permission to leave phone message Yes      Patient questions:  Do you have a fever, pain , or abdominal swelling? No. Pain Score  0 *  Have you tolerated food without any problems? Yes.    Have you been able to return to your normal activities? Yes.    Do you have any questions about your discharge instructions: Diet   No. Medications  No. Follow up visit  No.  Do you have questions or concerns about your Care? No.  Actions: * If pain score is 4 or above: No action needed, pain <4.

## 2015-03-16 NOTE — Op Note (Signed)
[place]  [employer]. [place], [address]   COLONOSCOPY PROCEDURE REPORT  PATIENT: [name], Shelby Shaw  MR#: [id number] BIRTHDATE: 07/01/1955 , 28  yrs. old GENDER: female ENDOSCOPIST: Eustace Quail, MD REFERRED [id number] Program Recall PROCEDURE DATE:  03/15/2015 PROCEDURE:   Colonoscopy, surveillance and Colonoscopy with snare polypectomy X3 First Screening Colonoscopy - Avg.  risk and is 50 yrs.  old or older - No.  Prior Negative Screening - Now for repeat screening. N/A  History of Adenoma - Now for follow-up colonoscopy & has been > or = to 3 yrs.  Yes hx of adenoma.  Has been 3 or more years since last colonoscopy.  Polyps removed today? Yes ASA CLASS:   Class II INDICATIONS:Surveillance due to prior colonic neoplasia and PH Colon Adenoma. . Index examination May 2011 with 15 mm tubulovillous adenoma MEDICATIONS: Monitored anesthesia care and Propofol 300 mg IV  DESCRIPTION OF PROCEDURE:   After the risks benefits and alternatives of the procedure were thoroughly explained, informed consent was obtained.  The digital rectal exam revealed no abnormalities of the rectum.   The LB [id number]  endoscope was introduced through the anus and advanced to the cecum, which was identified by both the appendix and ileocecal valve. No adverse events experienced.   The quality of the prep was excellent. (Suprep was used)  The instrument was then slowly withdrawn as the colon was fully examined. Estimated blood loss is zero unless otherwise noted in this procedure report.   COLON FINDINGS: Three polyps ranging between 4-66mm in size were found in the transverse colon and sigmoid colon.  A polypectomy was performed with a cold snare.  The resection was complete, the polyp tissue was completely retrieved and sent to histology.   The examination was otherwise normal.  Retroflexed views revealed no abnormalities. The time to cecum = 5.0 Withdrawal time = 10.4    The scope was withdrawn and the procedure completed. COMPLICATIONS: There were no immediate complications.  ENDOSCOPIC IMPRESSION: 1.   Three polyps were found in the transverse colon and sigmoid colon; polypectomy was performed with a cold snare 2.   The examination was otherwise normal  RECOMMENDATIONS: 1. Repeat Colonoscopy in 3 years (Multiple polyps).  eSigned:  Eustace Quail, MD 03/15/2015 2:51 PM   cc: The Patient and Burnard Bunting, MD

## 2015-03-21 ENCOUNTER — Encounter: Payer: Self-pay | Admitting: Internal Medicine

## 2015-09-01 DIAGNOSIS — Z0000 Encounter for general adult medical examination without abnormal findings: Secondary | ICD-10-CM | POA: Diagnosis not present

## 2015-09-01 DIAGNOSIS — Z1231 Encounter for screening mammogram for malignant neoplasm of breast: Secondary | ICD-10-CM | POA: Diagnosis not present

## 2015-09-01 DIAGNOSIS — Z01419 Encounter for gynecological examination (general) (routine) without abnormal findings: Secondary | ICD-10-CM | POA: Diagnosis not present

## 2015-09-01 DIAGNOSIS — Z1389 Encounter for screening for other disorder: Secondary | ICD-10-CM | POA: Diagnosis not present

## 2015-09-01 DIAGNOSIS — Z6827 Body mass index (BMI) 27.0-27.9, adult: Secondary | ICD-10-CM | POA: Diagnosis not present

## 2015-09-18 DIAGNOSIS — F329 Major depressive disorder, single episode, unspecified: Secondary | ICD-10-CM | POA: Diagnosis not present

## 2016-02-16 DIAGNOSIS — F329 Major depressive disorder, single episode, unspecified: Secondary | ICD-10-CM | POA: Diagnosis not present

## 2016-05-01 DIAGNOSIS — B001 Herpesviral vesicular dermatitis: Secondary | ICD-10-CM | POA: Diagnosis not present

## 2016-05-01 DIAGNOSIS — Z6825 Body mass index (BMI) 25.0-25.9, adult: Secondary | ICD-10-CM | POA: Diagnosis not present

## 2016-06-27 DIAGNOSIS — S99922A Unspecified injury of left foot, initial encounter: Secondary | ICD-10-CM | POA: Diagnosis not present

## 2016-08-04 DIAGNOSIS — M25511 Pain in right shoulder: Secondary | ICD-10-CM | POA: Diagnosis not present

## 2016-08-04 DIAGNOSIS — S335XXA Sprain of ligaments of lumbar spine, initial encounter: Secondary | ICD-10-CM | POA: Diagnosis not present

## 2016-08-08 ENCOUNTER — Other Ambulatory Visit: Payer: Self-pay | Admitting: Sports Medicine

## 2016-08-08 DIAGNOSIS — M5416 Radiculopathy, lumbar region: Secondary | ICD-10-CM | POA: Diagnosis not present

## 2016-08-08 DIAGNOSIS — M545 Low back pain, unspecified: Secondary | ICD-10-CM

## 2016-08-28 ENCOUNTER — Other Ambulatory Visit: Payer: BLUE CROSS/BLUE SHIELD

## 2016-08-30 DIAGNOSIS — F329 Major depressive disorder, single episode, unspecified: Secondary | ICD-10-CM | POA: Diagnosis not present

## 2016-09-09 ENCOUNTER — Ambulatory Visit
Admission: RE | Admit: 2016-09-09 | Discharge: 2016-09-09 | Disposition: A | Discharge status: Home / Self Care | Payer: BLUE CROSS/BLUE SHIELD | Source: Ambulatory Visit | Attending: Sports Medicine | Admitting: Sports Medicine

## 2016-09-09 DIAGNOSIS — M545 Low back pain, unspecified: Secondary | ICD-10-CM

## 2016-09-09 DIAGNOSIS — S3992XA Unspecified injury of lower back, initial encounter: Secondary | ICD-10-CM | POA: Diagnosis not present

## 2016-10-07 DIAGNOSIS — Z1231 Encounter for screening mammogram for malignant neoplasm of breast: Secondary | ICD-10-CM | POA: Diagnosis not present

## 2016-10-07 DIAGNOSIS — E2839 Other primary ovarian failure: Secondary | ICD-10-CM | POA: Diagnosis not present

## 2016-10-07 DIAGNOSIS — Z6830 Body mass index (BMI) 30.0-30.9, adult: Secondary | ICD-10-CM | POA: Diagnosis not present

## 2016-10-07 DIAGNOSIS — Z1389 Encounter for screening for other disorder: Secondary | ICD-10-CM | POA: Diagnosis not present

## 2016-10-07 DIAGNOSIS — Z0000 Encounter for general adult medical examination without abnormal findings: Secondary | ICD-10-CM | POA: Diagnosis not present

## 2016-10-07 DIAGNOSIS — N951 Menopausal and female climacteric states: Secondary | ICD-10-CM | POA: Diagnosis not present

## 2016-10-07 DIAGNOSIS — Z01419 Encounter for gynecological examination (general) (routine) without abnormal findings: Secondary | ICD-10-CM | POA: Diagnosis not present

## 2016-10-10 ENCOUNTER — Other Ambulatory Visit: Payer: Self-pay | Admitting: Obstetrics and Gynecology

## 2016-10-10 DIAGNOSIS — E2839 Other primary ovarian failure: Secondary | ICD-10-CM

## 2016-10-16 ENCOUNTER — Other Ambulatory Visit: Payer: Self-pay | Admitting: Internal Medicine

## 2016-10-16 DIAGNOSIS — Z6827 Body mass index (BMI) 27.0-27.9, adult: Secondary | ICD-10-CM | POA: Diagnosis not present

## 2016-10-16 DIAGNOSIS — E663 Overweight: Secondary | ICD-10-CM | POA: Diagnosis not present

## 2016-10-16 DIAGNOSIS — D509 Iron deficiency anemia, unspecified: Secondary | ICD-10-CM | POA: Diagnosis not present

## 2016-10-16 DIAGNOSIS — R740 Nonspecific elevation of levels of transaminase and lactic acid dehydrogenase [LDH]: Principal | ICD-10-CM

## 2016-10-16 DIAGNOSIS — R7401 Elevation of levels of liver transaminase levels: Secondary | ICD-10-CM

## 2016-10-21 ENCOUNTER — Ambulatory Visit
Admission: RE | Admit: 2016-10-21 | Discharge: 2016-10-21 | Disposition: A | Discharge status: Home / Self Care | Payer: BLUE CROSS/BLUE SHIELD | Source: Ambulatory Visit | Attending: Internal Medicine | Admitting: Internal Medicine

## 2016-10-21 DIAGNOSIS — R7401 Elevation of levels of liver transaminase levels: Secondary | ICD-10-CM

## 2016-10-21 DIAGNOSIS — R740 Nonspecific elevation of levels of transaminase and lactic acid dehydrogenase [LDH]: Principal | ICD-10-CM

## 2016-10-21 DIAGNOSIS — R945 Abnormal results of liver function studies: Secondary | ICD-10-CM | POA: Diagnosis not present

## 2017-01-29 DIAGNOSIS — M5136 Other intervertebral disc degeneration, lumbar region: Secondary | ICD-10-CM | POA: Diagnosis not present

## 2017-01-29 DIAGNOSIS — M7061 Trochanteric bursitis, right hip: Secondary | ICD-10-CM | POA: Diagnosis not present

## 2017-02-10 DIAGNOSIS — F329 Major depressive disorder, single episode, unspecified: Secondary | ICD-10-CM | POA: Diagnosis not present

## 2017-04-13 DIAGNOSIS — J00 Acute nasopharyngitis [common cold]: Secondary | ICD-10-CM | POA: Diagnosis not present

## 2017-05-13 DIAGNOSIS — F329 Major depressive disorder, single episode, unspecified: Secondary | ICD-10-CM | POA: Diagnosis not present

## 2017-05-28 DIAGNOSIS — S8001XA Contusion of right knee, initial encounter: Secondary | ICD-10-CM | POA: Diagnosis not present

## 2017-06-27 DIAGNOSIS — S8001XD Contusion of right knee, subsequent encounter: Secondary | ICD-10-CM | POA: Diagnosis not present

## 2017-06-27 DIAGNOSIS — S83241A Other tear of medial meniscus, current injury, right knee, initial encounter: Secondary | ICD-10-CM | POA: Diagnosis not present

## 2017-07-11 DIAGNOSIS — M25561 Pain in right knee: Secondary | ICD-10-CM | POA: Diagnosis not present

## 2017-07-16 DIAGNOSIS — M25561 Pain in right knee: Secondary | ICD-10-CM | POA: Diagnosis not present

## 2017-07-16 DIAGNOSIS — M1711 Unilateral primary osteoarthritis, right knee: Secondary | ICD-10-CM | POA: Diagnosis not present

## 2017-07-31 DIAGNOSIS — M25472 Effusion, left ankle: Secondary | ICD-10-CM | POA: Diagnosis not present

## 2017-08-07 DIAGNOSIS — S93492A Sprain of other ligament of left ankle, initial encounter: Secondary | ICD-10-CM | POA: Diagnosis not present

## 2017-08-07 DIAGNOSIS — M1711 Unilateral primary osteoarthritis, right knee: Secondary | ICD-10-CM | POA: Diagnosis not present

## 2017-08-13 DIAGNOSIS — M1711 Unilateral primary osteoarthritis, right knee: Secondary | ICD-10-CM | POA: Diagnosis not present

## 2017-08-19 DIAGNOSIS — M25572 Pain in left ankle and joints of left foot: Secondary | ICD-10-CM | POA: Diagnosis not present

## 2017-08-19 DIAGNOSIS — M1711 Unilateral primary osteoarthritis, right knee: Secondary | ICD-10-CM | POA: Diagnosis not present

## 2017-09-16 DIAGNOSIS — S8001XA Contusion of right knee, initial encounter: Secondary | ICD-10-CM | POA: Diagnosis not present

## 2017-10-21 DIAGNOSIS — Z0000 Encounter for general adult medical examination without abnormal findings: Secondary | ICD-10-CM | POA: Diagnosis not present

## 2017-10-21 DIAGNOSIS — Z01419 Encounter for gynecological examination (general) (routine) without abnormal findings: Secondary | ICD-10-CM | POA: Diagnosis not present

## 2017-10-21 DIAGNOSIS — Z6830 Body mass index (BMI) 30.0-30.9, adult: Secondary | ICD-10-CM | POA: Diagnosis not present

## 2017-10-21 DIAGNOSIS — Z1389 Encounter for screening for other disorder: Secondary | ICD-10-CM | POA: Diagnosis not present

## 2017-10-21 DIAGNOSIS — Z1231 Encounter for screening mammogram for malignant neoplasm of breast: Secondary | ICD-10-CM | POA: Diagnosis not present

## 2017-10-21 DIAGNOSIS — Z23 Encounter for immunization: Secondary | ICD-10-CM | POA: Diagnosis not present

## 2017-10-21 DIAGNOSIS — Z124 Encounter for screening for malignant neoplasm of cervix: Secondary | ICD-10-CM | POA: Diagnosis not present

## 2017-11-26 DIAGNOSIS — R7301 Impaired fasting glucose: Secondary | ICD-10-CM | POA: Diagnosis not present

## 2017-11-26 DIAGNOSIS — E038 Other specified hypothyroidism: Secondary | ICD-10-CM | POA: Diagnosis not present

## 2018-01-08 DIAGNOSIS — M19072 Primary osteoarthritis, left ankle and foot: Secondary | ICD-10-CM | POA: Diagnosis not present

## 2018-01-29 DIAGNOSIS — M19072 Primary osteoarthritis, left ankle and foot: Secondary | ICD-10-CM | POA: Diagnosis not present

## 2018-01-29 DIAGNOSIS — M25572 Pain in left ankle and joints of left foot: Secondary | ICD-10-CM | POA: Diagnosis not present

## 2018-02-05 DIAGNOSIS — M25572 Pain in left ankle and joints of left foot: Secondary | ICD-10-CM | POA: Diagnosis not present

## 2018-03-05 DIAGNOSIS — F329 Major depressive disorder, single episode, unspecified: Secondary | ICD-10-CM | POA: Diagnosis not present

## 2018-03-09 DIAGNOSIS — M25572 Pain in left ankle and joints of left foot: Secondary | ICD-10-CM | POA: Diagnosis not present

## 2018-03-09 DIAGNOSIS — G8929 Other chronic pain: Secondary | ICD-10-CM | POA: Diagnosis not present

## 2018-03-23 ENCOUNTER — Encounter: Payer: Self-pay | Admitting: Gastroenterology

## 2018-04-29 DIAGNOSIS — S8001XA Contusion of right knee, initial encounter: Secondary | ICD-10-CM | POA: Diagnosis not present

## 2018-04-29 DIAGNOSIS — M25532 Pain in left wrist: Secondary | ICD-10-CM | POA: Diagnosis not present

## 2018-04-29 DIAGNOSIS — M25551 Pain in right hip: Secondary | ICD-10-CM | POA: Diagnosis not present

## 2018-04-29 DIAGNOSIS — M47816 Spondylosis without myelopathy or radiculopathy, lumbar region: Secondary | ICD-10-CM | POA: Diagnosis not present

## 2018-05-20 ENCOUNTER — Encounter: Payer: Self-pay | Admitting: Internal Medicine

## 2018-05-20 DIAGNOSIS — M19072 Primary osteoarthritis, left ankle and foot: Secondary | ICD-10-CM | POA: Diagnosis not present

## 2018-05-20 DIAGNOSIS — M25561 Pain in right knee: Secondary | ICD-10-CM | POA: Diagnosis not present

## 2018-05-20 DIAGNOSIS — M1711 Unilateral primary osteoarthritis, right knee: Secondary | ICD-10-CM | POA: Diagnosis not present

## 2018-05-28 DIAGNOSIS — F329 Major depressive disorder, single episode, unspecified: Secondary | ICD-10-CM | POA: Diagnosis not present

## 2018-06-17 DIAGNOSIS — E038 Other specified hypothyroidism: Secondary | ICD-10-CM | POA: Diagnosis not present

## 2018-06-17 DIAGNOSIS — E7849 Other hyperlipidemia: Secondary | ICD-10-CM | POA: Diagnosis not present

## 2018-06-17 DIAGNOSIS — R7301 Impaired fasting glucose: Secondary | ICD-10-CM | POA: Diagnosis not present

## 2018-06-17 DIAGNOSIS — D509 Iron deficiency anemia, unspecified: Secondary | ICD-10-CM | POA: Diagnosis not present

## 2018-06-23 DIAGNOSIS — E038 Other specified hypothyroidism: Secondary | ICD-10-CM | POA: Diagnosis not present

## 2018-06-23 DIAGNOSIS — D509 Iron deficiency anemia, unspecified: Secondary | ICD-10-CM | POA: Diagnosis not present

## 2018-06-23 DIAGNOSIS — D510 Vitamin B12 deficiency anemia due to intrinsic factor deficiency: Secondary | ICD-10-CM | POA: Diagnosis not present

## 2018-06-23 DIAGNOSIS — E039 Hypothyroidism, unspecified: Secondary | ICD-10-CM | POA: Diagnosis not present

## 2018-07-20 DIAGNOSIS — F329 Major depressive disorder, single episode, unspecified: Secondary | ICD-10-CM | POA: Diagnosis not present

## 2018-08-03 DIAGNOSIS — M79644 Pain in right finger(s): Secondary | ICD-10-CM | POA: Diagnosis not present

## 2018-08-03 DIAGNOSIS — B999 Unspecified infectious disease: Secondary | ICD-10-CM | POA: Diagnosis not present

## 2018-11-18 DIAGNOSIS — M1711 Unilateral primary osteoarthritis, right knee: Secondary | ICD-10-CM | POA: Diagnosis not present

## 2018-11-18 DIAGNOSIS — M19072 Primary osteoarthritis, left ankle and foot: Secondary | ICD-10-CM | POA: Diagnosis not present

## 2018-11-25 DIAGNOSIS — M1711 Unilateral primary osteoarthritis, right knee: Secondary | ICD-10-CM | POA: Diagnosis not present

## 2018-12-02 DIAGNOSIS — M170 Bilateral primary osteoarthritis of knee: Secondary | ICD-10-CM | POA: Diagnosis not present

## 2018-12-22 DIAGNOSIS — Z1389 Encounter for screening for other disorder: Secondary | ICD-10-CM | POA: Diagnosis not present

## 2018-12-22 DIAGNOSIS — Z01419 Encounter for gynecological examination (general) (routine) without abnormal findings: Secondary | ICD-10-CM | POA: Diagnosis not present

## 2018-12-22 DIAGNOSIS — Z6832 Body mass index (BMI) 32.0-32.9, adult: Secondary | ICD-10-CM | POA: Diagnosis not present

## 2018-12-22 DIAGNOSIS — Z0000 Encounter for general adult medical examination without abnormal findings: Secondary | ICD-10-CM | POA: Diagnosis not present

## 2018-12-22 DIAGNOSIS — Z1231 Encounter for screening mammogram for malignant neoplasm of breast: Secondary | ICD-10-CM | POA: Diagnosis not present

## 2019-01-12 DIAGNOSIS — F329 Major depressive disorder, single episode, unspecified: Secondary | ICD-10-CM | POA: Diagnosis not present

## 2019-01-14 DIAGNOSIS — M19072 Primary osteoarthritis, left ankle and foot: Secondary | ICD-10-CM | POA: Diagnosis not present

## 2019-01-14 DIAGNOSIS — M25572 Pain in left ankle and joints of left foot: Secondary | ICD-10-CM | POA: Diagnosis not present

## 2019-01-14 DIAGNOSIS — G8929 Other chronic pain: Secondary | ICD-10-CM | POA: Diagnosis not present

## 2019-01-29 DIAGNOSIS — M89372 Hypertrophy of bone, left ankle and foot: Secondary | ICD-10-CM | POA: Diagnosis not present

## 2019-01-29 DIAGNOSIS — M19072 Primary osteoarthritis, left ankle and foot: Secondary | ICD-10-CM | POA: Diagnosis not present

## 2019-02-11 DIAGNOSIS — M19072 Primary osteoarthritis, left ankle and foot: Secondary | ICD-10-CM | POA: Diagnosis not present

## 2019-04-21 DIAGNOSIS — R070 Pain in throat: Secondary | ICD-10-CM | POA: Diagnosis not present

## 2019-04-21 DIAGNOSIS — M791 Myalgia, unspecified site: Secondary | ICD-10-CM | POA: Diagnosis not present

## 2019-04-21 DIAGNOSIS — Z20828 Contact with and (suspected) exposure to other viral communicable diseases: Secondary | ICD-10-CM | POA: Diagnosis not present

## 2019-05-04 ENCOUNTER — Other Ambulatory Visit: Payer: Self-pay | Admitting: Sports Medicine

## 2019-05-04 DIAGNOSIS — M1712 Unilateral primary osteoarthritis, left knee: Secondary | ICD-10-CM | POA: Diagnosis not present

## 2019-05-04 DIAGNOSIS — M545 Low back pain, unspecified: Secondary | ICD-10-CM

## 2019-05-04 DIAGNOSIS — M25551 Pain in right hip: Secondary | ICD-10-CM | POA: Diagnosis not present

## 2019-05-04 DIAGNOSIS — M4726 Other spondylosis with radiculopathy, lumbar region: Secondary | ICD-10-CM | POA: Diagnosis not present

## 2019-05-24 ENCOUNTER — Ambulatory Visit
Admission: RE | Admit: 2019-05-24 | Discharge: 2019-05-24 | Disposition: A | Discharge status: Home / Self Care | Payer: BLUE CROSS/BLUE SHIELD | Source: Ambulatory Visit | Attending: Sports Medicine | Admitting: Sports Medicine

## 2019-05-24 ENCOUNTER — Other Ambulatory Visit: Payer: Self-pay

## 2019-05-24 DIAGNOSIS — M48061 Spinal stenosis, lumbar region without neurogenic claudication: Secondary | ICD-10-CM | POA: Diagnosis not present

## 2019-05-24 DIAGNOSIS — M545 Low back pain, unspecified: Secondary | ICD-10-CM

## 2019-05-26 DIAGNOSIS — M4726 Other spondylosis with radiculopathy, lumbar region: Secondary | ICD-10-CM | POA: Diagnosis not present

## 2019-05-28 DIAGNOSIS — M4726 Other spondylosis with radiculopathy, lumbar region: Secondary | ICD-10-CM | POA: Diagnosis not present

## 2019-06-10 DIAGNOSIS — F329 Major depressive disorder, single episode, unspecified: Secondary | ICD-10-CM | POA: Diagnosis not present

## 2019-06-15 ENCOUNTER — Other Ambulatory Visit (HOSPITAL_COMMUNITY): Payer: Self-pay | Admitting: Sports Medicine

## 2019-06-15 ENCOUNTER — Ambulatory Visit (HOSPITAL_COMMUNITY)
Admission: RE | Admit: 2019-06-15 | Discharge: 2019-06-15 | Disposition: A | Discharge status: Home / Self Care | Payer: BC Managed Care – PPO | Source: Ambulatory Visit | Attending: Vascular Surgery | Admitting: Vascular Surgery

## 2019-06-15 ENCOUNTER — Other Ambulatory Visit: Payer: Self-pay

## 2019-06-15 DIAGNOSIS — M79605 Pain in left leg: Secondary | ICD-10-CM | POA: Diagnosis not present

## 2019-06-15 DIAGNOSIS — M4726 Other spondylosis with radiculopathy, lumbar region: Secondary | ICD-10-CM | POA: Diagnosis not present

## 2019-06-15 DIAGNOSIS — M19072 Primary osteoarthritis, left ankle and foot: Secondary | ICD-10-CM | POA: Diagnosis not present

## 2019-06-15 DIAGNOSIS — M7989 Other specified soft tissue disorders: Secondary | ICD-10-CM | POA: Insufficient documentation

## 2019-06-23 DIAGNOSIS — M545 Low back pain: Secondary | ICD-10-CM | POA: Diagnosis not present

## 2019-06-23 DIAGNOSIS — M5416 Radiculopathy, lumbar region: Secondary | ICD-10-CM | POA: Diagnosis not present

## 2019-07-22 DIAGNOSIS — M25572 Pain in left ankle and joints of left foot: Secondary | ICD-10-CM | POA: Diagnosis not present

## 2019-07-22 DIAGNOSIS — M19072 Primary osteoarthritis, left ankle and foot: Secondary | ICD-10-CM | POA: Diagnosis not present

## 2019-07-22 DIAGNOSIS — Q688 Other specified congenital musculoskeletal deformities: Secondary | ICD-10-CM | POA: Diagnosis not present

## 2019-07-27 DIAGNOSIS — M7712 Lateral epicondylitis, left elbow: Secondary | ICD-10-CM | POA: Diagnosis not present

## 2019-07-27 DIAGNOSIS — M25532 Pain in left wrist: Secondary | ICD-10-CM | POA: Diagnosis not present

## 2019-07-27 DIAGNOSIS — M5416 Radiculopathy, lumbar region: Secondary | ICD-10-CM | POA: Diagnosis not present

## 2019-08-11 DIAGNOSIS — M545 Low back pain: Secondary | ICD-10-CM | POA: Diagnosis not present

## 2019-08-11 DIAGNOSIS — M5416 Radiculopathy, lumbar region: Secondary | ICD-10-CM | POA: Diagnosis not present

## 2019-09-29 DIAGNOSIS — M1712 Unilateral primary osteoarthritis, left knee: Secondary | ICD-10-CM | POA: Diagnosis not present

## 2019-09-29 DIAGNOSIS — S838X2A Sprain of other specified parts of left knee, initial encounter: Secondary | ICD-10-CM | POA: Diagnosis not present

## 2019-10-11 DIAGNOSIS — F329 Major depressive disorder, single episode, unspecified: Secondary | ICD-10-CM | POA: Diagnosis not present

## 2019-10-20 DIAGNOSIS — D510 Vitamin B12 deficiency anemia due to intrinsic factor deficiency: Secondary | ICD-10-CM | POA: Diagnosis not present

## 2019-10-20 DIAGNOSIS — R7301 Impaired fasting glucose: Secondary | ICD-10-CM | POA: Diagnosis not present

## 2019-10-20 DIAGNOSIS — E039 Hypothyroidism, unspecified: Secondary | ICD-10-CM | POA: Diagnosis not present

## 2019-10-20 DIAGNOSIS — D509 Iron deficiency anemia, unspecified: Secondary | ICD-10-CM | POA: Diagnosis not present

## 2019-12-08 DIAGNOSIS — M1712 Unilateral primary osteoarthritis, left knee: Secondary | ICD-10-CM | POA: Diagnosis not present

## 2019-12-15 DIAGNOSIS — M1712 Unilateral primary osteoarthritis, left knee: Secondary | ICD-10-CM | POA: Diagnosis not present

## 2019-12-20 DIAGNOSIS — Z1152 Encounter for screening for COVID-19: Secondary | ICD-10-CM | POA: Diagnosis not present

## 2019-12-20 DIAGNOSIS — U071 COVID-19: Secondary | ICD-10-CM | POA: Diagnosis not present

## 2019-12-20 DIAGNOSIS — R05 Cough: Secondary | ICD-10-CM | POA: Diagnosis not present

## 2019-12-21 ENCOUNTER — Telehealth (HOSPITAL_COMMUNITY): Payer: Self-pay | Admitting: Nurse Practitioner

## 2019-12-21 ENCOUNTER — Encounter: Payer: Self-pay | Admitting: Nurse Practitioner

## 2019-12-21 DIAGNOSIS — U071 COVID-19: Secondary | ICD-10-CM | POA: Insufficient documentation

## 2019-12-21 NOTE — Telephone Encounter (Signed)
Called to Discuss with patient about Covid symptoms and the use of regeneron, a monoclonal antibody infusion for those with mild to moderate Covid symptoms and at a high risk of hospitalization.     Pt is qualified for this infusion at the [facility] due to co-morbid conditions and/or a member of an at-risk group.     Unable to reach pt. Left message to return call. Sent mychart message.   Jarah Pember, DNP, AGNP-C [phone] (Infusion Center Hotline)  

## 2019-12-22 ENCOUNTER — Other Ambulatory Visit: Payer: Self-pay | Admitting: Infectious Diseases

## 2019-12-22 ENCOUNTER — Ambulatory Visit (HOSPITAL_COMMUNITY)
Admission: RE | Admit: 2019-12-22 | Discharge: 2019-12-22 | Disposition: A | Discharge status: Home / Self Care | Payer: BC Managed Care – PPO | Source: Ambulatory Visit | Attending: Pulmonary Disease | Admitting: Pulmonary Disease

## 2019-12-22 ENCOUNTER — Telehealth: Payer: Self-pay | Admitting: Infectious Diseases

## 2019-12-22 DIAGNOSIS — Z6833 Body mass index (BMI) 33.0-33.9, adult: Secondary | ICD-10-CM | POA: Diagnosis not present

## 2019-12-22 DIAGNOSIS — U071 COVID-19: Secondary | ICD-10-CM

## 2019-12-22 MED ORDER — METHYLPREDNISOLONE SODIUM SUCC 125 MG IJ SOLR: 125.0000 mg | Freq: Once | INTRAMUSCULAR | Status: DC | PRN

## 2019-12-22 MED ORDER — SODIUM CHLORIDE 0.9 % IV SOLN
1200.0000 mg | Freq: Once | INTRAVENOUS | Status: AC
  Administered 2019-12-22: 1200 mg via INTRAVENOUS

## 2019-12-22 MED ORDER — SODIUM CHLORIDE 0.9 % IV SOLN: INTRAVENOUS | Status: DC | PRN

## 2019-12-22 MED ORDER — FAMOTIDINE IN NACL 20-0.9 MG/50ML-% IV SOLN: 20.0000 mg | Freq: Once | INTRAVENOUS | Status: DC | PRN

## 2019-12-22 MED ORDER — ALBUTEROL SULFATE HFA 108 (90 BASE) MCG/ACT IN AERS: 2.0000 | INHALATION_SPRAY | Freq: Once | RESPIRATORY_TRACT | Status: DC | PRN

## 2019-12-22 MED ORDER — EPINEPHRINE 0.3 MG/0.3ML IJ SOAJ: 0.3000 mg | Freq: Once | INTRAMUSCULAR | Status: DC | PRN

## 2019-12-22 MED ORDER — DIPHENHYDRAMINE HCL 50 MG/ML IJ SOLN: 50.0000 mg | Freq: Once | INTRAMUSCULAR | Status: DC | PRN

## 2019-12-22 NOTE — Progress Notes (Signed)
  Diagnosis: COVID-19  Physician: Dr. Asencion Noble  Procedure: Covid Infusion Clinic Med: casirivimab\imdevimab infusion - Provided patient with casirivimab\imdevimab fact sheet for patients, parents and caregivers prior to infusion.  Complications: No immediate complications noted.  Discharge: Discharged home   Heide Scales 12/22/2019

## 2019-12-22 NOTE — Progress Notes (Signed)
I connected by phone with Shelby Shaw on 12/22/2019 at 11:21 AM to discuss the potential use of a new treatment for mild to moderate COVID-19 viral infection in non-hospitalized patients.  This patient is a 64 y.o. female that meets the FDA criteria for Emergency Use Authorization of COVID monoclonal antibody casirivimab/imdevimab.  Has a (+) direct SARS-CoV-2 viral test result  Has mild or moderate COVID-19   Is NOT hospitalized due to COVID-19  Is within 10 days of symptom onset  Has at least one of the high risk factor(s) for progression to severe COVID-19 and/or hospitalization as defined in EUA.  Specific high risk criteria : BMI > 25   I have spoken and communicated the following to the patient or parent/caregiver regarding COVID monoclonal antibody treatment:  1. FDA has authorized the emergency use for the treatment of mild to moderate COVID-19 in adults and pediatric patients with positive results of direct SARS-CoV-2 viral testing who are 34 years of age and older weighing at least 40 kg, and who are at high risk for progressing to severe COVID-19 and/or hospitalization.  2. The significant known and potential risks and benefits of COVID monoclonal antibody, and the extent to which such potential risks and benefits are unknown.  3. Information on available alternative treatments and the risks and benefits of those alternatives, including clinical trials.  4. Patients treated with COVID monoclonal antibody should continue to self-isolate and use infection control measures (e.g., wear mask, isolate, social distance, avoid sharing personal items, clean and disinfect high touch surfaces, and frequent handwashing) according to CDC guidelines.   5. The patient or parent/caregiver has the option to accept or refuse COVID monoclonal antibody treatment.  After reviewing this information with the patient, The patient agreed to proceed with receiving casirivimab\imdevimab infusion and  will be provided a copy of the Fact sheet prior to receiving the infusion. Shelby Shaw 12/22/2019 11:21 AM

## 2019-12-22 NOTE — Discharge Instructions (Signed)

## 2019-12-22 NOTE — Telephone Encounter (Signed)
Called to Discuss with patient about Covid symptoms and the use of the monoclonal antibody infusion for those with mild to moderate Covid symptoms and at a high risk of hospitalization.     Pt appears to qualify for this infusion due to co-morbid conditions and/or a member of an at-risk group in accordance with the FDA Emergency Use Authorization.    Started with symptoms last Thursday (day 6)  Risk qualifiers include BMI > [place], MSN, NP-C [facility] [email or web address] Pager: [phone] Office: [phone] Speed: [phone]

## 2020-01-25 DIAGNOSIS — M1712 Unilateral primary osteoarthritis, left knee: Secondary | ICD-10-CM | POA: Diagnosis not present

## 2020-01-25 DIAGNOSIS — M19072 Primary osteoarthritis, left ankle and foot: Secondary | ICD-10-CM | POA: Diagnosis not present

## 2020-02-21 ENCOUNTER — Encounter: Payer: Self-pay | Admitting: Internal Medicine

## 2020-02-21 ENCOUNTER — Other Ambulatory Visit: Payer: Self-pay | Admitting: Obstetrics and Gynecology

## 2020-02-21 DIAGNOSIS — Z1231 Encounter for screening mammogram for malignant neoplasm of breast: Secondary | ICD-10-CM

## 2020-03-08 DIAGNOSIS — M5416 Radiculopathy, lumbar region: Secondary | ICD-10-CM | POA: Diagnosis not present

## 2020-03-09 DIAGNOSIS — F329 Major depressive disorder, single episode, unspecified: Secondary | ICD-10-CM | POA: Diagnosis not present

## 2020-03-30 DIAGNOSIS — M5126 Other intervertebral disc displacement, lumbar region: Secondary | ICD-10-CM | POA: Diagnosis not present

## 2020-03-30 DIAGNOSIS — M5416 Radiculopathy, lumbar region: Secondary | ICD-10-CM | POA: Diagnosis not present

## 2020-04-06 ENCOUNTER — Ambulatory Visit: Payer: BC Managed Care – PPO

## 2020-04-07 DIAGNOSIS — M5416 Radiculopathy, lumbar region: Secondary | ICD-10-CM | POA: Diagnosis not present

## 2020-04-07 DIAGNOSIS — M5126 Other intervertebral disc displacement, lumbar region: Secondary | ICD-10-CM | POA: Diagnosis not present

## 2020-04-27 DIAGNOSIS — Z0000 Encounter for general adult medical examination without abnormal findings: Secondary | ICD-10-CM | POA: Diagnosis not present

## 2020-04-27 DIAGNOSIS — Z1231 Encounter for screening mammogram for malignant neoplasm of breast: Secondary | ICD-10-CM | POA: Diagnosis not present

## 2020-05-02 ENCOUNTER — Encounter: Payer: BC Managed Care – PPO | Admitting: Internal Medicine

## 2020-05-04 DIAGNOSIS — M5126 Other intervertebral disc displacement, lumbar region: Secondary | ICD-10-CM | POA: Diagnosis not present

## 2020-05-04 DIAGNOSIS — M5416 Radiculopathy, lumbar region: Secondary | ICD-10-CM | POA: Diagnosis not present

## 2020-05-09 DIAGNOSIS — S83412A Sprain of medial collateral ligament of left knee, initial encounter: Secondary | ICD-10-CM | POA: Diagnosis not present

## 2020-05-09 DIAGNOSIS — M25562 Pain in left knee: Secondary | ICD-10-CM | POA: Diagnosis not present

## 2020-05-09 DIAGNOSIS — M25561 Pain in right knee: Secondary | ICD-10-CM | POA: Diagnosis not present

## 2020-05-09 DIAGNOSIS — S2231XA Fracture of one rib, right side, initial encounter for closed fracture: Secondary | ICD-10-CM | POA: Diagnosis not present

## 2020-05-30 ENCOUNTER — Encounter: Payer: BC Managed Care – PPO | Admitting: Internal Medicine

## 2020-06-01 DIAGNOSIS — S83242A Other tear of medial meniscus, current injury, left knee, initial encounter: Secondary | ICD-10-CM | POA: Diagnosis not present

## 2020-06-02 ENCOUNTER — Other Ambulatory Visit: Payer: Self-pay | Admitting: Physical Medicine and Rehabilitation

## 2020-06-02 DIAGNOSIS — M545 Low back pain, unspecified: Secondary | ICD-10-CM

## 2020-06-13 DIAGNOSIS — B9689 Other specified bacterial agents as the cause of diseases classified elsewhere: Secondary | ICD-10-CM | POA: Diagnosis not present

## 2020-06-13 DIAGNOSIS — J019 Acute sinusitis, unspecified: Secondary | ICD-10-CM | POA: Diagnosis not present

## 2020-06-24 ENCOUNTER — Other Ambulatory Visit: Payer: BC Managed Care – PPO

## 2020-06-27 ENCOUNTER — Ambulatory Visit
Admission: RE | Admit: 2020-06-27 | Discharge: 2020-06-27 | Disposition: A | Discharge status: Home / Self Care | Payer: BC Managed Care – PPO | Source: Ambulatory Visit | Attending: Physical Medicine and Rehabilitation | Admitting: Physical Medicine and Rehabilitation

## 2020-06-27 DIAGNOSIS — M545 Low back pain, unspecified: Secondary | ICD-10-CM

## 2020-06-27 DIAGNOSIS — M48061 Spinal stenosis, lumbar region without neurogenic claudication: Secondary | ICD-10-CM | POA: Diagnosis not present

## 2020-06-29 DIAGNOSIS — M545 Low back pain, unspecified: Secondary | ICD-10-CM | POA: Diagnosis not present

## 2020-06-29 DIAGNOSIS — M47816 Spondylosis without myelopathy or radiculopathy, lumbar region: Secondary | ICD-10-CM | POA: Diagnosis not present

## 2020-06-29 DIAGNOSIS — M5416 Radiculopathy, lumbar region: Secondary | ICD-10-CM | POA: Diagnosis not present

## 2020-07-05 DIAGNOSIS — Z01419 Encounter for gynecological examination (general) (routine) without abnormal findings: Secondary | ICD-10-CM | POA: Diagnosis not present

## 2020-07-05 DIAGNOSIS — Z1389 Encounter for screening for other disorder: Secondary | ICD-10-CM | POA: Diagnosis not present

## 2020-07-05 DIAGNOSIS — N951 Menopausal and female climacteric states: Secondary | ICD-10-CM | POA: Diagnosis not present

## 2020-07-05 DIAGNOSIS — Z6831 Body mass index (BMI) 31.0-31.9, adult: Secondary | ICD-10-CM | POA: Diagnosis not present

## 2020-09-25 IMAGING — MR MR LUMBAR SPINE W/O CM
4 of 5 series · 24 of 48 positions shown · non-contrast
Comparison: No pertinent prior studies available for comparison.

CLINICAL DATA: Low back pain, unspecified back pain laterality,
unspecified chronicity, unspecified whether sciatica present.
Additional history provided by technologist: Patient reports chronic
low back pain with pain down left leg and right hip on and off for 2
years, patient reports fall 9 months ago with increased pain since
that time.

EXAM:
MRI LUMBAR SPINE WITHOUT CONTRAST
TECHNIQUE: Multiplanar, multisequence MR imaging of the lumbar spine was
performed. No intravenous contrast was administered.

[Series 3: T1 · sagittal · 4.0mm · 0.55mm/px · 5 of 13 slices shown (1 of 2)]
[im 1/13]
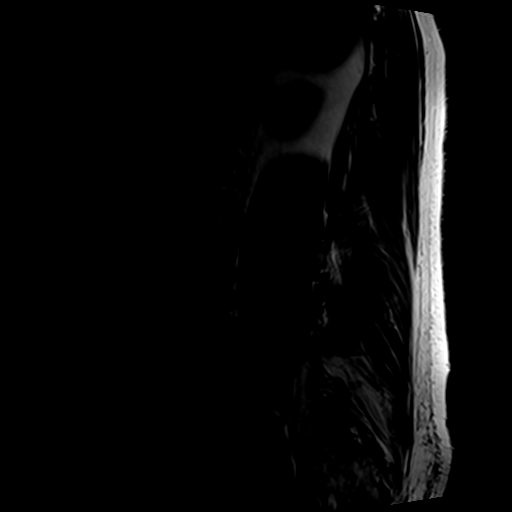
[im 4/13]
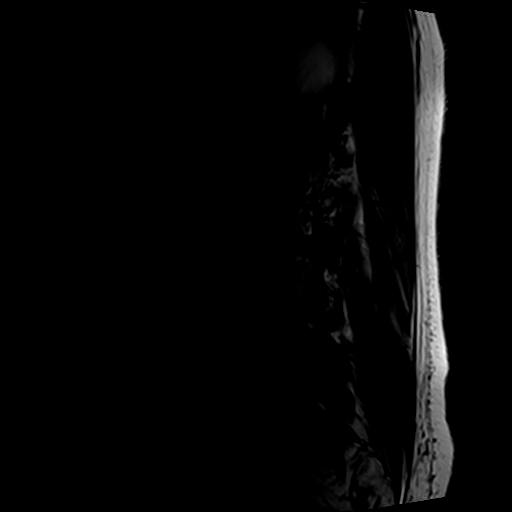
[im 7/13]
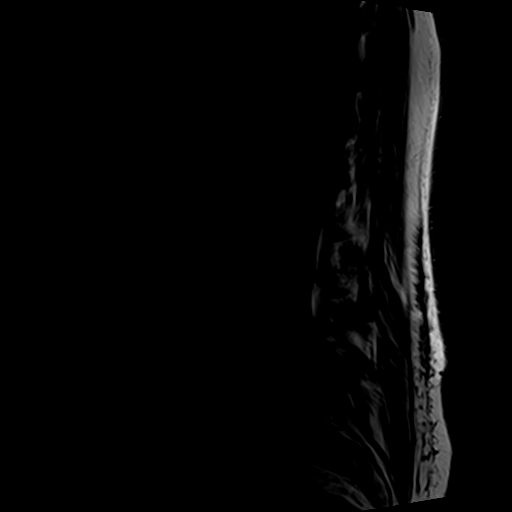
[im 10/13]
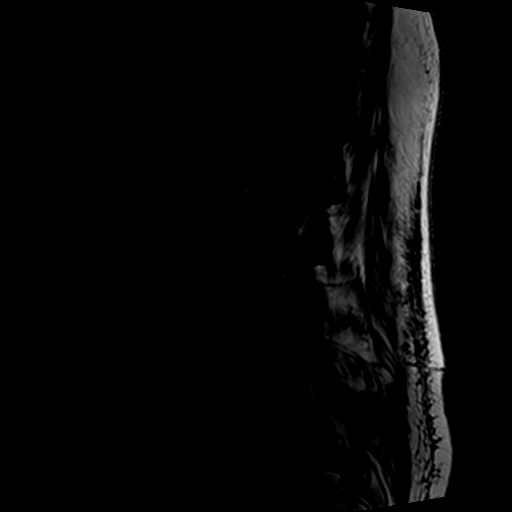
[im 13/13]
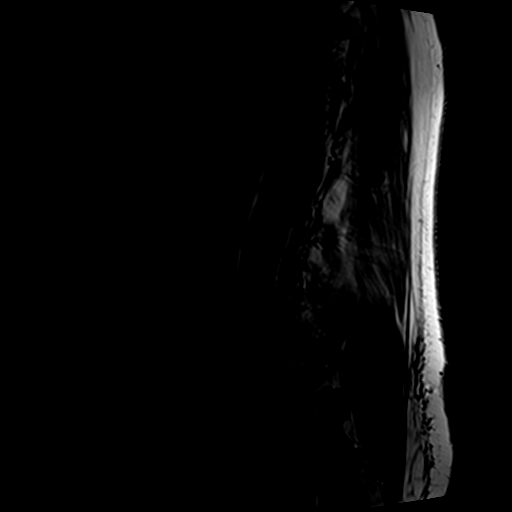

[Series 4: T2 · sagittal · 4.0mm · 0.55mm/px · 5 of 13 slices shown (1 of 2)]
[im 1/13]
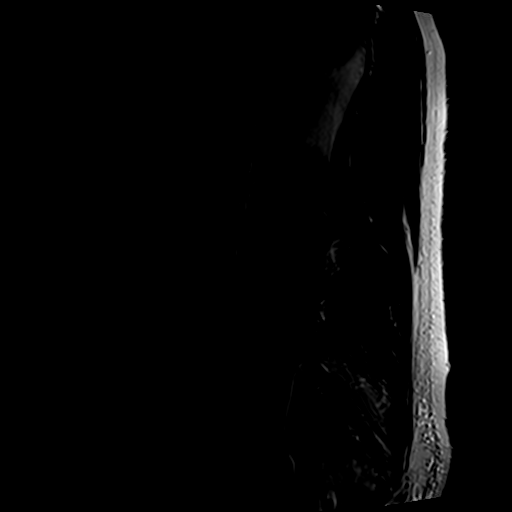
[im 4/13]
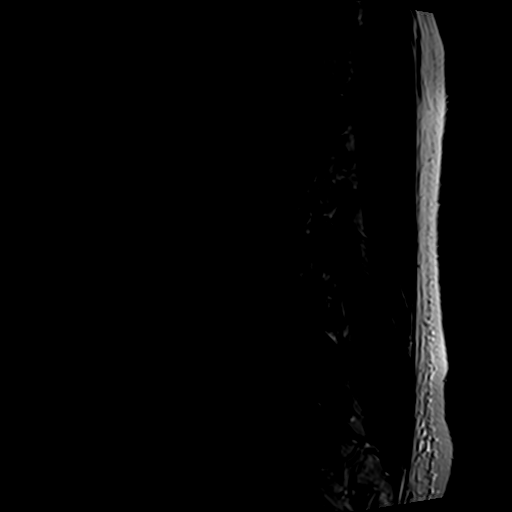
[im 7/13]
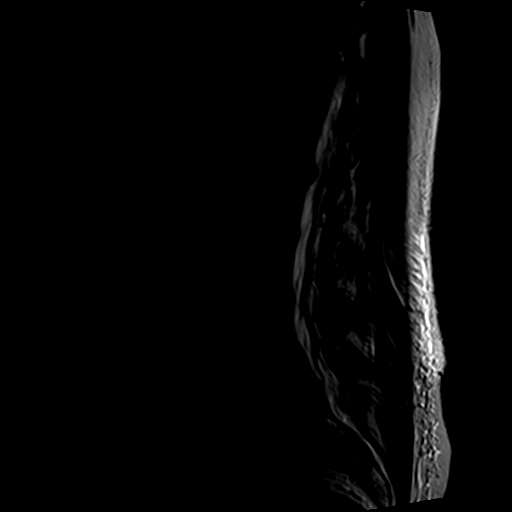
[im 10/13]
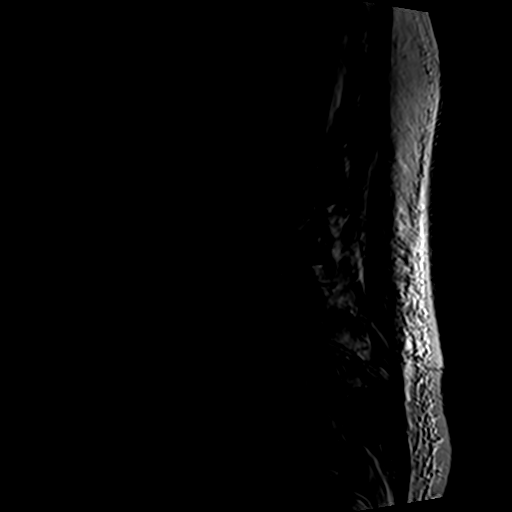
[im 13/13]
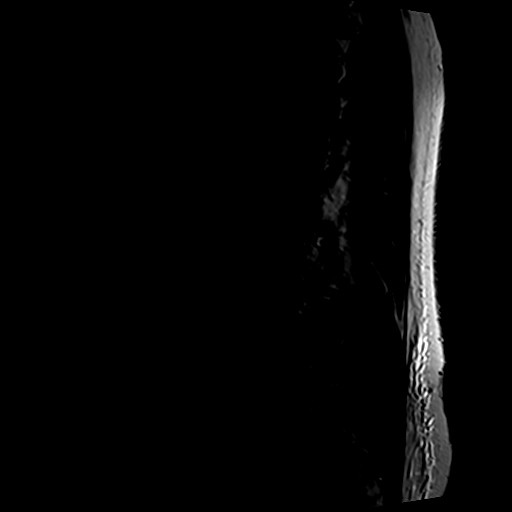

[Series 6: T1 · axial · 4.0mm · 0.35mm/px · z∈[-86,+100]mm · 4 of 41 slices shown (2 of 2)]
[im 3/41]
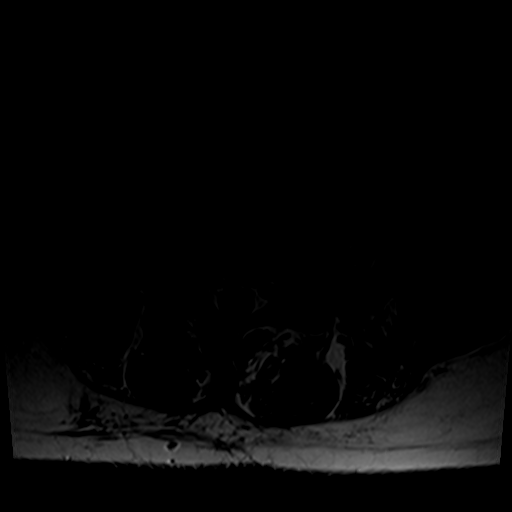
[im 6/41]
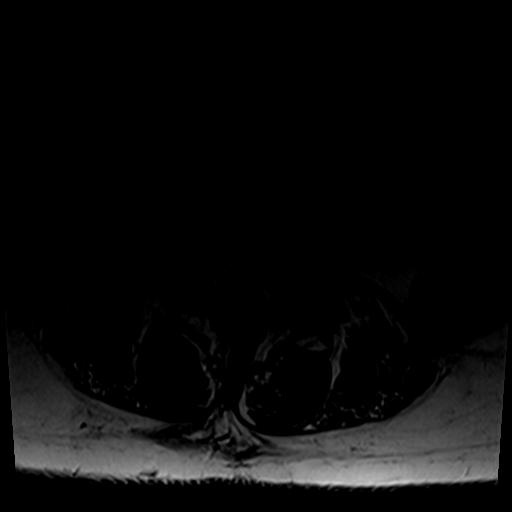
[im 22/41]
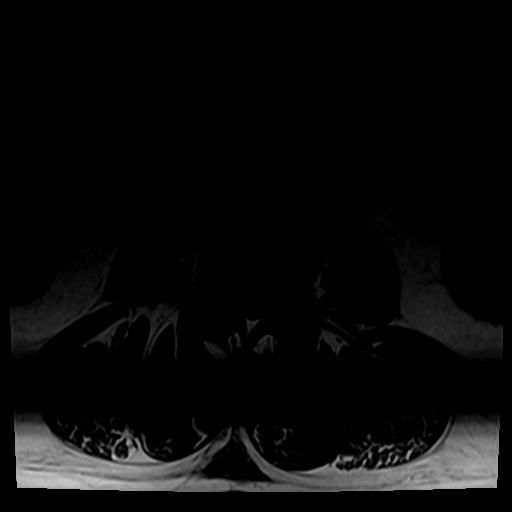
[im 35/41]
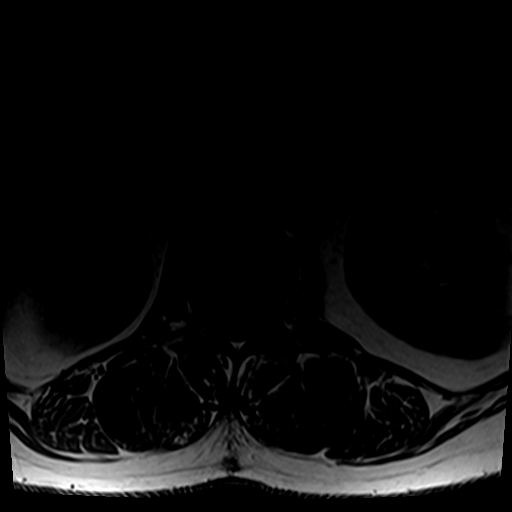

[Series 7: T2 · axial · 4.0mm · 0.70mm/px · z∈[-86,+131]mm · 10 of 41 slices shown (2 of 2)]
[im 3/41]
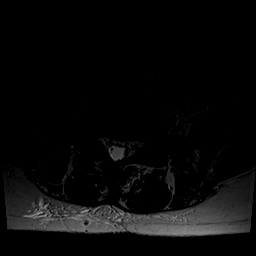
[im 6/41]
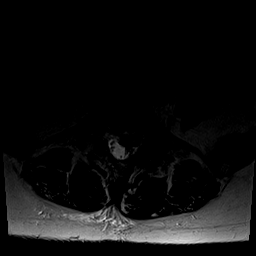
[im 9/41]
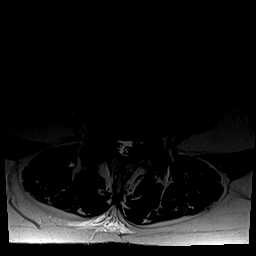
[im 14/41]
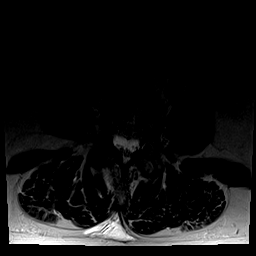
[im 19/41]
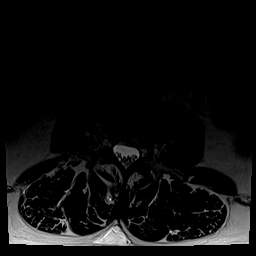
[im 22/41]
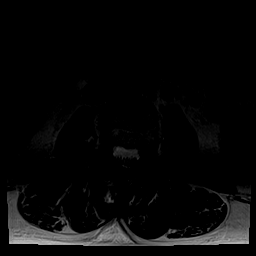
[im 25/41]
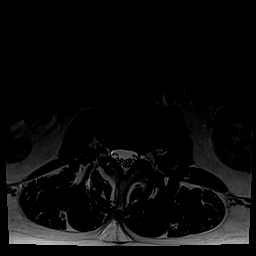
[im 30/41]
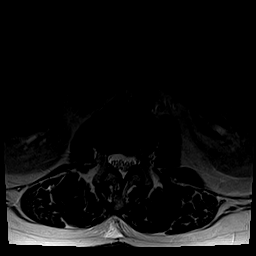
[im 35/41]
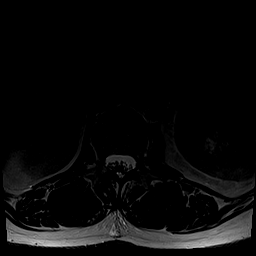
[im 41/41]
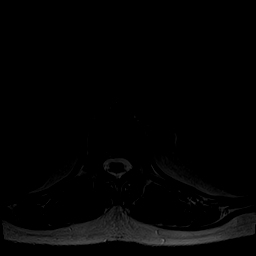

[24 of 48 positions shown; findings below may reference images not displayed]

FINDINGS: Mild intermittent motion degradation.

Segmentation: For the purposes of this dictation, five lumbar
vertebrae are assumed and the caudal most well-formed intervertebral
disc is designated L5-S1.

Alignment: Straightening of the expected lumbar lordosis. 2 mm L4-L5
grade 1 anterolisthesis.

Vertebrae:  No marrow edema or suspicious osseous lesion.

Conus medullaris and cauda equina: Conus extends to the L1-L2 level.
No signal abnormality within the visualized distal spinal cord.

Paraspinal and other soft tissues: No abnormality identified within
included portions of the abdomen/retroperitoneum. Nonspecific edema
signal within the dorsal subcutaneous fat overlying the lumbar
spine.

Disc levels:

Mild/moderate L4-L5 disc degeneration. Mild disc degeneration at the
remaining levels.

T11-T12: This level is not included on axial imaging. Small disc
bulge with facet/ligamentum flavum hypertrophy. No significant
spinal canal stenosis or neural foraminal narrowing.

T12-L1: Minimal disc bulge. No significant spinal canal or neural
foraminal narrowing.

L1-L2: Disc bulge. Superimposed broad-based left center disc
protrusion. The disc protrusion partially effaces the ventral thecal
sac and contributes to mild spinal canal stenosis. No frank nerve
root impingement. No significant foraminal stenosis.

L2-L3: Small disc bulge. Superimposed tiny central disc protrusion.
Mild facet arthrosis/ligamentum flavum hypertrophy. No significant
spinal canal stenosis or neural foraminal narrowing.

L3-L4: Small disc bulge. Mild facet arthrosis/ligamentum flavum
hypertrophy. Mild right subarticular narrowing without nerve root
impingement. Central canal patent. No significant foraminal
stenosis.

L4-L5: Grade 1 anterolisthesis. Disc uncovering with disc bulge.
Moderate facet arthrosis with ligamentum flavum hypertrophy.
Multifactorial bilateral subarticular narrowing (greater on the
right) with crowding of the descending right L5 nerve root (series
7, image 33). Mild central canal stenosis. Mild/moderate bilateral
neural foraminal narrowing.

L5-S1: Disc bulge. Superimposed 12 mm left center to left foraminal
disc extrusion with cranial migration to the lower L5 level (series
4, image 7) (series 6, image 35 and 36). Mild facet arthrosis. The
disc extrusion partially effaces the left ventral thecal sac,
encroaching upon the descending left S1 nerve root. Mild narrowing
of the central canal. The disc extrusion also contributes to
moderate left neural foraminal narrowing, contacting the exiting
left L5 nerve root (series 6, image 35). No significant right
foraminal stenosis.
IMPRESSION: Lumbar spondylosis as outlined and most notably as follows.

At L5-S1, disc bulge. Superimposed 12 mm left center to left
foraminal disc extrusion with cranial migration. Mild facet
arthrosis. The disc extrusion partially effaces the left ventral
thecal sac, encroaching upon the descending left S1 nerve root. Mild
narrowing of the central canal. The extrusion also contributes to
moderate left neural foraminal narrowing, contacting the exiting
left L5 nerve root.

At L4-L5, multifactorial bilateral subarticular narrowing (greater
on the right) with crowding of the descending right L5 nerve root.
Mild central canal stenosis. Mild/moderate bilateral neural
foraminal narrowing.

## 2020-09-30 DIAGNOSIS — S0101XA Laceration without foreign body of scalp, initial encounter: Secondary | ICD-10-CM | POA: Diagnosis not present

## 2020-09-30 DIAGNOSIS — Z8616 Personal history of COVID-19: Secondary | ICD-10-CM | POA: Diagnosis not present

## 2020-09-30 DIAGNOSIS — M545 Low back pain, unspecified: Secondary | ICD-10-CM | POA: Insufficient documentation

## 2020-09-30 DIAGNOSIS — Z23 Encounter for immunization: Secondary | ICD-10-CM | POA: Insufficient documentation

## 2020-09-30 DIAGNOSIS — S33140A Subluxation of L4/L5 lumbar vertebra, initial encounter: Secondary | ICD-10-CM | POA: Diagnosis not present

## 2020-09-30 DIAGNOSIS — S0990XA Unspecified injury of head, initial encounter: Secondary | ICD-10-CM | POA: Diagnosis not present

## 2020-09-30 DIAGNOSIS — Z79899 Other long term (current) drug therapy: Secondary | ICD-10-CM | POA: Diagnosis not present

## 2020-09-30 DIAGNOSIS — S0003XA Contusion of scalp, initial encounter: Secondary | ICD-10-CM | POA: Diagnosis not present

## 2020-09-30 DIAGNOSIS — W01198A Fall on same level from slipping, tripping and stumbling with subsequent striking against other object, initial encounter: Secondary | ICD-10-CM | POA: Diagnosis not present

## 2020-09-30 DIAGNOSIS — E039 Hypothyroidism, unspecified: Secondary | ICD-10-CM | POA: Diagnosis not present

## 2020-09-30 DIAGNOSIS — M47816 Spondylosis without myelopathy or radiculopathy, lumbar region: Secondary | ICD-10-CM | POA: Diagnosis not present

## 2020-10-01 ENCOUNTER — Emergency Department (HOSPITAL_COMMUNITY): Payer: BC Managed Care – PPO

## 2020-10-01 ENCOUNTER — Other Ambulatory Visit: Payer: Self-pay

## 2020-10-01 ENCOUNTER — Emergency Department (HOSPITAL_COMMUNITY)
Admission: EM | Admit: 2020-10-01 | Discharge: 2020-10-01 | Disposition: A | Discharge status: Home / Self Care | Payer: BC Managed Care – PPO | Attending: Emergency Medicine | Admitting: Emergency Medicine

## 2020-10-01 DIAGNOSIS — M545 Low back pain, unspecified: Secondary | ICD-10-CM

## 2020-10-01 DIAGNOSIS — S0003XA Contusion of scalp, initial encounter: Secondary | ICD-10-CM | POA: Diagnosis not present

## 2020-10-01 DIAGNOSIS — W19XXXA Unspecified fall, initial encounter: Secondary | ICD-10-CM

## 2020-10-01 DIAGNOSIS — M47816 Spondylosis without myelopathy or radiculopathy, lumbar region: Secondary | ICD-10-CM | POA: Diagnosis not present

## 2020-10-01 DIAGNOSIS — S0101XA Laceration without foreign body of scalp, initial encounter: Secondary | ICD-10-CM

## 2020-10-01 DIAGNOSIS — S33140A Subluxation of L4/L5 lumbar vertebra, initial encounter: Secondary | ICD-10-CM | POA: Diagnosis not present

## 2020-10-01 MED ORDER — TETANUS-DIPHTH-ACELL PERTUSSIS 5-2.5-18.5 LF-MCG/0.5 IM SUSY
0.5000 mL | PREFILLED_SYRINGE | Freq: Once | INTRAMUSCULAR | Status: AC
  Administered 2020-10-01: 0.5 mL via INTRAMUSCULAR
  Filled 2020-10-01: qty 0.5

## 2020-10-01 MED ORDER — HYDROCODONE-ACETAMINOPHEN 5-325 MG PO TABS
1.0000 | ORAL_TABLET | Freq: Once | ORAL | Status: AC
  Administered 2020-10-01: 1 via ORAL
  Filled 2020-10-01: qty 1

## 2020-10-01 MED ORDER — HYDROCODONE-ACETAMINOPHEN 5-325 MG PO TABS: 1.0000 | ORAL_TABLET | ORAL | 0 refills | Status: DC | PRN

## 2020-10-01 MED ORDER — LIDOCAINE-EPINEPHRINE (PF) 2 %-1:200000 IJ SOLN
10.0000 mL | Freq: Once | INTRAMUSCULAR | Status: AC
  Administered 2020-10-01: 10 mL
  Filled 2020-10-01: qty 20

## 2020-10-01 NOTE — ED Triage Notes (Addendum)
Pt came in with c/o fall. Tripped on her slippers fell backward and hit her head on hardwood floor. No LOC, no blood thinners except asa. Pt states that her lumbar spine hurts as well

## 2020-10-01 NOTE — Discharge Instructions (Addendum)
The staples will need to be removed in 10 days. This can be done by your doctor or at any Urgent Care.   Take Norco as prescribed for severe pain. You can take Tylenol and/or ibuprofen if needed for mild to moderate pain, taking care not to take more than 4000 mg Tylenol (acetaminophen) in a 24 hour period.

## 2020-10-01 NOTE — ED Provider Notes (Signed)
[facility] DEPT Provider Note   CSN: [id number] Arrival date & time: 09/30/20  2359     History Chief Complaint  Patient presents with   Fall   Head Injury    Shelby Shaw is a 65 y.o. female.  Patient to ED for evaluation after a mechanical fall, having slipped on slick surface and falling backward. No LOC, vomiting. She got up with assistance from her husband and has been ambulatory. No anticoagulation more than aspirin. She reports a laceration to the back of her head and acute exacerbation of lower back pain where she has chronic pain from arthritis. No other injuries.   The history is provided by the patient and the spouse. No language interpreter was used.  Fall Associated symptoms include headaches. Pertinent negatives include no chest pain, no abdominal pain and no shortness of breath.  Head Injury Associated symptoms: headache   Associated symptoms: no nausea and no neck pain       Past Medical History:  Diagnosis Date   Allergy    Anemia    Anxiety    Depression    Dysrhythmia    "skipping beat" seems better since started Thyroid medication   Family history of anesthesia complication    mother has PONV this is patients first surgery   GERD (gastroesophageal reflux disease)    Headache(784.0)    migraines   Hypothyroidism     Patient Active Problem List   Diagnosis Date Noted   COVID-19 virus infection 12/21/2019   S/P laparoscopic assisted vaginal hysterectomy (LAVH) 09/09/2013   RLQ PAIN 08/21/2009   ABDOMINAL PAIN -GENERALIZED 08/21/2009   POLYPECTOMY, HX OF 08/21/2009    Past Surgical History:  Procedure Laterality Date   COLONOSCOPY  08-16-2009   LAPAROSCOPIC ASSISTED VAGINAL HYSTERECTOMY Bilateral 09/09/2013   Procedure: LAPAROSCOPIC ASSISTED VAGINAL HYSTERECTOMY, Bilateral Salpingo-oophorectomy;  Surgeon: Logan Bores, MD;  Location: [facility];  Service: Gynecology;  Laterality: Bilateral;   POLYPECTOMY   08-16-2009   TVA     OB History   No obstetric history on file.     Family History  Problem Relation Age of Onset   Colon polyps Father    Colon cancer Neg Hx    Esophageal cancer Neg Hx    Rectal cancer Neg Hx    Stomach cancer Neg Hx     Social History   Tobacco Use   Smoking status: Never   Smokeless tobacco: Never  Substance Use Topics   Alcohol use: Yes    Alcohol/week: 0.0 standard drinks    Comment: occasionally but rare    Drug use: No    Home Medications Prior to Admission medications   Medication Sig Start Date End Date Taking? Authorizing Provider  acetaminophen-codeine (TYLENOL #3) 300-30 MG tablet Take by mouth every 4 (four) hours as needed for moderate pain.    [provider]  azithromycin (ZITHROMAX) 250 MG tablet  03/10/15   [provider]  cetirizine (ZYRTEC) 10 MG tablet Take 10 mg by mouth daily as needed for allergies. Reported on 03/15/2015    [provider]  Cholecalciferol (VITAMIN D PO) Take 2 tablets by mouth daily.    [provider]  clonazePAM (KLONOPIN) 1 MG tablet Take 1-2 mg by mouth daily as needed for anxiety (up to 3 a day).     [provider]  estradiol (ESTRACE) 1 MG tablet Take 1 mg by mouth daily.    [provider]  fluticasone (FLONASE) 50  MCG/ACT nasal spray Place 1 spray into both nostrils daily as needed for allergies or rhinitis.    [provider]  ibuprofen (ADVIL,MOTRIN) 600 MG tablet Take 1 tablet (600 mg total) by mouth every 6 (six) hours as needed (mild pain). Patient not taking: Reported on 03/15/2015 09/10/13   Paula Compton, MD  levothyroxine (SYNTHROID, LEVOTHROID) 50 MCG tablet Take 50 mcg by mouth daily before breakfast.    [provider]  Multiple Vitamin (MULTIVITAMIN) tablet Take 1 tablet by mouth daily.    [provider]  Omega-3 Fatty Acids (FISH OIL) 1000 MG CAPS Take 2,000 mg by mouth daily.    [provider]   omeprazole (PRILOSEC) 20 MG capsule Take 20 mg by mouth every evening.    [provider]  oxyCODONE-acetaminophen (PERCOCET/ROXICET) 5-325 MG per tablet Take 1-2 tablets by mouth every 4 (four) hours as needed for severe pain (moderate to severe pain (when tolerating fluids)). 09/10/13   Paula Compton, MD  promethazine (PHENERGAN) 25 MG tablet Take 25 mg by mouth every 6 (six) hours as needed for nausea or vomiting (as needed for migraines). Reported on 03/15/2015    [provider]  rizatriptan (MAXALT) 5 MG tablet Take 5 mg by mouth as needed for migraine. Reported on 03/15/2015    [provider]  sertraline (ZOLOFT) 100 MG tablet Take 100 mg by mouth at bedtime.    [provider]    Allergies    Patient has no known allergies.  Review of Systems   Review of Systems  Constitutional:  Negative for fever.  Respiratory:  Negative for shortness of breath.   Cardiovascular:  Negative for chest pain.  Gastrointestinal:  Negative for abdominal pain and nausea.  Musculoskeletal:  Positive for back pain. Negative for neck pain.  Skin:  Positive for wound.  Neurological:  Positive for headaches. Negative for syncope.   Physical Exam Updated Vital Signs BP (!) 169/94 (BP Location: Left Arm)   Temp 98.3 F (36.8 C) (Oral)   Resp 18   LMP 08/29/2013   SpO2 100%   Physical Exam Vitals and nursing note reviewed.  Constitutional:      General: She is not in acute distress.    Appearance: She is well-developed. She is not ill-appearing.  HENT:     Head: Normocephalic.     Comments: Occipital hematoma with 2 cm laceration.     Nose: Nose normal.  Eyes:     Pupils: Pupils are equal, round, and reactive to light.  Cardiovascular:     Rate and Rhythm: Normal rate.  Pulmonary:     Effort: Pulmonary effort is normal.  Chest:     Chest wall: No tenderness.  Abdominal:     Tenderness: There is no abdominal tenderness.  Musculoskeletal:         General: Normal range of motion.     Cervical back: Normal range of motion.     Comments: No midline or paracervical tenderness. Full pain free ROM of her neck. There is midline and bilateral paralumbar tenderness without swelling or deformity. No hip or pelvic tenderness. FROM all extremities without strength deficits.   Skin:    General: Skin is warm and dry.  Neurological:     Mental Status: She is alert and oriented to person, place, and time.    ED Results / Procedures / Treatments   Labs (all labs ordered are listed, but only abnormal results are displayed) Labs Reviewed - No data to  display  EKG None  Radiology No results found.  Procedures .Marland KitchenLaceration Repair  Date/Time: 10/01/2020 2:11 AM Performed by: Charlann Lange, PA-C Authorized by: Charlann Lange, PA-C   Consent:    Consent obtained:  Verbal   Consent given by:  Patient Universal protocol:    Procedure explained and questions answered to patient or proxy's satisfaction: yes     Immediately prior to procedure, a time out was called: yes     Patient identity confirmed:  Verbally with patient Anesthesia:    Anesthesia method:  Local infiltration   Local anesthetic:  Lidocaine 2% WITH epi Laceration details:    Location:  Scalp   Scalp location:  Occipital   Length (cm):  2 Treatment:    Area cleansed with:  Saline   Amount of cleaning:  Standard Skin repair:    Repair method:  Staples   Number of staples:  3 Approximation:    Approximation:  Close Repair type:    Repair type:  Simple Post-procedure details:    Dressing:  Open (no dressing)   Procedure completion:  Tolerated well, no immediate complications   Medications Ordered in ED Medications  lidocaine-EPINEPHrine (XYLOCAINE W/EPI) 2 %-1:200000 (PF) injection 10 mL (has no administration in time range)  Tdap (BOOSTRIX) injection 0.5 mL (has no administration in time range)    ED Course  I have reviewed the triage vital signs and the nursing  notes.  Pertinent labs & imaging results that were available during my care of the patient were reviewed by me and considered in my medical decision making (see chart for details).    MDM Rules/Calculators/A&P                          Patient to ED after mechanical fall with head laceration and lower back pain.   She is well appearing, in NAD. VSS, mild hypertension. No active bleeding from small occipital laceration.   Tetanus updated. Head CT and lumbar xray negative for acute finding. Would repaired as per procedure note. She has been seen by Dr. Christy Gentles. She is felt appropriate for discharge home.   Final Clinical Impression(s) / ED Diagnoses Final diagnoses:  None   Mechanical fall Occipital laceration Low back pain  Rx / DC Orders ED Discharge Orders     None        Dennie Bible 10/01/20 0216    Ripley Fraise, MD 10/01/20 [phone]

## 2020-10-10 DIAGNOSIS — G43009 Migraine without aura, not intractable, without status migrainosus: Secondary | ICD-10-CM | POA: Diagnosis not present

## 2020-10-23 DIAGNOSIS — F329 Major depressive disorder, single episode, unspecified: Secondary | ICD-10-CM | POA: Diagnosis not present

## 2020-12-12 DIAGNOSIS — M1712 Unilateral primary osteoarthritis, left knee: Secondary | ICD-10-CM | POA: Diagnosis not present

## 2020-12-12 DIAGNOSIS — M25572 Pain in left ankle and joints of left foot: Secondary | ICD-10-CM | POA: Diagnosis not present

## 2021-01-31 DIAGNOSIS — Z008 Encounter for other general examination: Secondary | ICD-10-CM | POA: Diagnosis not present

## 2021-01-31 DIAGNOSIS — D510 Vitamin B12 deficiency anemia due to intrinsic factor deficiency: Secondary | ICD-10-CM | POA: Diagnosis not present

## 2021-01-31 DIAGNOSIS — E039 Hypothyroidism, unspecified: Secondary | ICD-10-CM | POA: Diagnosis not present

## 2021-01-31 DIAGNOSIS — Z1339 Encounter for screening examination for other mental health and behavioral disorders: Secondary | ICD-10-CM | POA: Diagnosis not present

## 2021-01-31 DIAGNOSIS — Z0000 Encounter for general adult medical examination without abnormal findings: Secondary | ICD-10-CM | POA: Diagnosis not present

## 2021-01-31 DIAGNOSIS — R82998 Other abnormal findings in urine: Secondary | ICD-10-CM | POA: Diagnosis not present

## 2021-01-31 DIAGNOSIS — R7301 Impaired fasting glucose: Secondary | ICD-10-CM | POA: Diagnosis not present

## 2021-01-31 DIAGNOSIS — Z1331 Encounter for screening for depression: Secondary | ICD-10-CM | POA: Diagnosis not present

## 2021-01-31 DIAGNOSIS — E785 Hyperlipidemia, unspecified: Secondary | ICD-10-CM | POA: Diagnosis not present

## 2021-02-12 DIAGNOSIS — F329 Major depressive disorder, single episode, unspecified: Secondary | ICD-10-CM | POA: Diagnosis not present

## 2021-05-07 DIAGNOSIS — H2513 Age-related nuclear cataract, bilateral: Secondary | ICD-10-CM | POA: Diagnosis not present

## 2021-05-07 DIAGNOSIS — H18413 Arcus senilis, bilateral: Secondary | ICD-10-CM | POA: Diagnosis not present

## 2021-05-07 DIAGNOSIS — H25013 Cortical age-related cataract, bilateral: Secondary | ICD-10-CM | POA: Diagnosis not present

## 2021-06-27 DIAGNOSIS — M19072 Primary osteoarthritis, left ankle and foot: Secondary | ICD-10-CM | POA: Diagnosis not present

## 2021-07-17 DIAGNOSIS — F329 Major depressive disorder, single episode, unspecified: Secondary | ICD-10-CM | POA: Diagnosis not present

## 2021-07-18 DIAGNOSIS — Z1389 Encounter for screening for other disorder: Secondary | ICD-10-CM | POA: Diagnosis not present

## 2021-07-18 DIAGNOSIS — Z130 Encounter for screening for diseases of the blood and blood-forming organs and certain disorders involving the immune mechanism: Secondary | ICD-10-CM | POA: Diagnosis not present

## 2021-07-18 DIAGNOSIS — N951 Menopausal and female climacteric states: Secondary | ICD-10-CM | POA: Diagnosis not present

## 2021-07-18 DIAGNOSIS — M1712 Unilateral primary osteoarthritis, left knee: Secondary | ICD-10-CM | POA: Diagnosis not present

## 2021-07-18 DIAGNOSIS — Z1231 Encounter for screening mammogram for malignant neoplasm of breast: Secondary | ICD-10-CM | POA: Diagnosis not present

## 2021-07-18 DIAGNOSIS — Z13220 Encounter for screening for lipoid disorders: Secondary | ICD-10-CM | POA: Diagnosis not present

## 2021-07-18 DIAGNOSIS — Z01419 Encounter for gynecological examination (general) (routine) without abnormal findings: Secondary | ICD-10-CM | POA: Diagnosis not present

## 2021-07-18 DIAGNOSIS — Z13228 Encounter for screening for other metabolic disorders: Secondary | ICD-10-CM | POA: Diagnosis not present

## 2021-07-18 DIAGNOSIS — M19072 Primary osteoarthritis, left ankle and foot: Secondary | ICD-10-CM | POA: Diagnosis not present

## 2021-07-18 DIAGNOSIS — E559 Vitamin D deficiency, unspecified: Secondary | ICD-10-CM | POA: Diagnosis not present

## 2021-10-07 DIAGNOSIS — S90121A Contusion of right lesser toe(s) without damage to nail, initial encounter: Secondary | ICD-10-CM | POA: Diagnosis not present

## 2021-10-22 DIAGNOSIS — F329 Major depressive disorder, single episode, unspecified: Secondary | ICD-10-CM | POA: Diagnosis not present

## 2021-11-22 DIAGNOSIS — R0781 Pleurodynia: Secondary | ICD-10-CM | POA: Diagnosis not present

## 2021-11-22 DIAGNOSIS — M898X5 Other specified disorders of bone, thigh: Secondary | ICD-10-CM | POA: Diagnosis not present

## 2022-03-11 DIAGNOSIS — E785 Hyperlipidemia, unspecified: Secondary | ICD-10-CM | POA: Diagnosis not present

## 2022-03-11 DIAGNOSIS — Z1212 Encounter for screening for malignant neoplasm of rectum: Secondary | ICD-10-CM | POA: Diagnosis not present

## 2022-03-11 DIAGNOSIS — E039 Hypothyroidism, unspecified: Secondary | ICD-10-CM | POA: Diagnosis not present

## 2022-03-11 DIAGNOSIS — E538 Deficiency of other specified B group vitamins: Secondary | ICD-10-CM | POA: Diagnosis not present

## 2022-03-11 DIAGNOSIS — R82998 Other abnormal findings in urine: Secondary | ICD-10-CM | POA: Diagnosis not present

## 2022-03-11 DIAGNOSIS — D509 Iron deficiency anemia, unspecified: Secondary | ICD-10-CM | POA: Diagnosis not present

## 2022-03-11 DIAGNOSIS — E663 Overweight: Secondary | ICD-10-CM | POA: Diagnosis not present

## 2022-03-11 DIAGNOSIS — Z0000 Encounter for general adult medical examination without abnormal findings: Secondary | ICD-10-CM | POA: Diagnosis not present

## 2022-03-11 DIAGNOSIS — R7301 Impaired fasting glucose: Secondary | ICD-10-CM | POA: Diagnosis not present

## 2022-04-08 DIAGNOSIS — H25811 Combined forms of age-related cataract, right eye: Secondary | ICD-10-CM | POA: Diagnosis not present

## 2022-04-08 DIAGNOSIS — Z01818 Encounter for other preprocedural examination: Secondary | ICD-10-CM | POA: Diagnosis not present

## 2022-04-10 DIAGNOSIS — F329 Major depressive disorder, single episode, unspecified: Secondary | ICD-10-CM | POA: Diagnosis not present

## 2022-04-10 DIAGNOSIS — R69 Illness, unspecified: Secondary | ICD-10-CM | POA: Diagnosis not present

## 2022-05-07 DIAGNOSIS — H269 Unspecified cataract: Secondary | ICD-10-CM | POA: Diagnosis not present

## 2022-05-07 DIAGNOSIS — H25811 Combined forms of age-related cataract, right eye: Secondary | ICD-10-CM | POA: Diagnosis not present

## 2022-05-07 DIAGNOSIS — H2511 Age-related nuclear cataract, right eye: Secondary | ICD-10-CM | POA: Diagnosis not present

## 2022-05-07 DIAGNOSIS — H52221 Regular astigmatism, right eye: Secondary | ICD-10-CM | POA: Diagnosis not present

## 2022-05-29 DIAGNOSIS — M1711 Unilateral primary osteoarthritis, right knee: Secondary | ICD-10-CM | POA: Diagnosis not present

## 2022-05-29 DIAGNOSIS — S8011XA Contusion of right lower leg, initial encounter: Secondary | ICD-10-CM | POA: Diagnosis not present

## 2022-06-04 DIAGNOSIS — T2122XA Burn of second degree of abdominal wall, initial encounter: Secondary | ICD-10-CM | POA: Diagnosis not present

## 2022-07-04 DIAGNOSIS — H269 Unspecified cataract: Secondary | ICD-10-CM | POA: Diagnosis not present

## 2022-07-04 DIAGNOSIS — H52202 Unspecified astigmatism, left eye: Secondary | ICD-10-CM | POA: Diagnosis not present

## 2022-07-04 DIAGNOSIS — H2512 Age-related nuclear cataract, left eye: Secondary | ICD-10-CM | POA: Diagnosis not present

## 2022-07-04 DIAGNOSIS — H52222 Regular astigmatism, left eye: Secondary | ICD-10-CM | POA: Diagnosis not present

## 2022-08-21 DIAGNOSIS — M19072 Primary osteoarthritis, left ankle and foot: Secondary | ICD-10-CM | POA: Diagnosis not present

## 2022-08-23 DIAGNOSIS — M25572 Pain in left ankle and joints of left foot: Secondary | ICD-10-CM | POA: Diagnosis not present

## 2022-09-09 DIAGNOSIS — Z130 Encounter for screening for diseases of the blood and blood-forming organs and certain disorders involving the immune mechanism: Secondary | ICD-10-CM | POA: Diagnosis not present

## 2022-09-09 DIAGNOSIS — N951 Menopausal and female climacteric states: Secondary | ICD-10-CM | POA: Diagnosis not present

## 2022-09-09 DIAGNOSIS — N9089 Other specified noninflammatory disorders of vulva and perineum: Secondary | ICD-10-CM | POA: Diagnosis not present

## 2022-09-09 DIAGNOSIS — N390 Urinary tract infection, site not specified: Secondary | ICD-10-CM | POA: Diagnosis not present

## 2022-09-09 DIAGNOSIS — Z1231 Encounter for screening mammogram for malignant neoplasm of breast: Secondary | ICD-10-CM | POA: Diagnosis not present

## 2022-09-09 DIAGNOSIS — Z01419 Encounter for gynecological examination (general) (routine) without abnormal findings: Secondary | ICD-10-CM | POA: Diagnosis not present

## 2022-11-01 DIAGNOSIS — F329 Major depressive disorder, single episode, unspecified: Secondary | ICD-10-CM | POA: Diagnosis not present

## 2022-11-19 DIAGNOSIS — M25572 Pain in left ankle and joints of left foot: Secondary | ICD-10-CM | POA: Diagnosis not present

## 2023-02-11 DIAGNOSIS — M19072 Primary osteoarthritis, left ankle and foot: Secondary | ICD-10-CM | POA: Diagnosis not present

## 2023-02-12 DIAGNOSIS — F329 Major depressive disorder, single episode, unspecified: Secondary | ICD-10-CM | POA: Diagnosis not present

## 2023-03-13 DIAGNOSIS — E559 Vitamin D deficiency, unspecified: Secondary | ICD-10-CM | POA: Diagnosis not present

## 2023-03-13 DIAGNOSIS — M1A09X0 Idiopathic chronic gout, multiple sites, without tophus (tophi): Secondary | ICD-10-CM | POA: Diagnosis not present

## 2023-03-13 DIAGNOSIS — S93601A Unspecified sprain of right foot, initial encounter: Secondary | ICD-10-CM | POA: Diagnosis not present

## 2023-03-13 DIAGNOSIS — R5383 Other fatigue: Secondary | ICD-10-CM | POA: Diagnosis not present

## 2023-03-19 DIAGNOSIS — S93601D Unspecified sprain of right foot, subsequent encounter: Secondary | ICD-10-CM | POA: Diagnosis not present

## 2023-04-01 DIAGNOSIS — S93601D Unspecified sprain of right foot, subsequent encounter: Secondary | ICD-10-CM | POA: Diagnosis not present

## 2023-04-05 DIAGNOSIS — M79671 Pain in right foot: Secondary | ICD-10-CM | POA: Diagnosis not present

## 2023-04-10 DIAGNOSIS — M79671 Pain in right foot: Secondary | ICD-10-CM | POA: Diagnosis not present

## 2023-05-27 DIAGNOSIS — M79671 Pain in right foot: Secondary | ICD-10-CM | POA: Diagnosis not present

## 2023-05-27 DIAGNOSIS — S93601D Unspecified sprain of right foot, subsequent encounter: Secondary | ICD-10-CM | POA: Diagnosis not present

## 2023-06-16 ENCOUNTER — Encounter: Payer: Self-pay | Admitting: Internal Medicine

## 2023-08-19 DIAGNOSIS — F329 Major depressive disorder, single episode, unspecified: Secondary | ICD-10-CM | POA: Diagnosis not present

## 2023-08-21 ENCOUNTER — Encounter: Payer: Self-pay | Admitting: Internal Medicine

## 2023-08-21 ENCOUNTER — Ambulatory Visit (AMBULATORY_SURGERY_CENTER)

## 2023-08-21 VITALS — Ht 63.0 in | Wt 174.0 lb

## 2023-08-21 DIAGNOSIS — Z860100 Personal history of colon polyps, unspecified: Secondary | ICD-10-CM

## 2023-08-21 MED ORDER — NA SULFATE-K SULFATE-MG SULF 17.5-3.13-1.6 GM/177ML PO SOLN: 1.0000 | Freq: Once | ORAL | 0 refills | Status: AC

## 2023-08-21 NOTE — Progress Notes (Signed)

## 2023-09-01 DIAGNOSIS — D509 Iron deficiency anemia, unspecified: Secondary | ICD-10-CM | POA: Diagnosis not present

## 2023-09-01 DIAGNOSIS — E039 Hypothyroidism, unspecified: Secondary | ICD-10-CM | POA: Diagnosis not present

## 2023-09-01 DIAGNOSIS — D510 Vitamin B12 deficiency anemia due to intrinsic factor deficiency: Secondary | ICD-10-CM | POA: Diagnosis not present

## 2023-09-01 DIAGNOSIS — R7301 Impaired fasting glucose: Secondary | ICD-10-CM | POA: Diagnosis not present

## 2023-09-01 DIAGNOSIS — E785 Hyperlipidemia, unspecified: Secondary | ICD-10-CM | POA: Diagnosis not present

## 2023-09-01 DIAGNOSIS — Z1212 Encounter for screening for malignant neoplasm of rectum: Secondary | ICD-10-CM | POA: Diagnosis not present

## 2023-09-01 DIAGNOSIS — Z79899 Other long term (current) drug therapy: Secondary | ICD-10-CM | POA: Diagnosis not present

## 2023-09-03 DIAGNOSIS — E785 Hyperlipidemia, unspecified: Secondary | ICD-10-CM | POA: Diagnosis not present

## 2023-09-03 DIAGNOSIS — G43009 Migraine without aura, not intractable, without status migrainosus: Secondary | ICD-10-CM | POA: Diagnosis not present

## 2023-09-03 DIAGNOSIS — K219 Gastro-esophageal reflux disease without esophagitis: Secondary | ICD-10-CM | POA: Diagnosis not present

## 2023-09-03 DIAGNOSIS — M19072 Primary osteoarthritis, left ankle and foot: Secondary | ICD-10-CM | POA: Diagnosis not present

## 2023-09-03 DIAGNOSIS — Z0000 Encounter for general adult medical examination without abnormal findings: Secondary | ICD-10-CM | POA: Diagnosis not present

## 2023-09-03 DIAGNOSIS — F329 Major depressive disorder, single episode, unspecified: Secondary | ICD-10-CM | POA: Diagnosis not present

## 2023-09-03 DIAGNOSIS — E039 Hypothyroidism, unspecified: Secondary | ICD-10-CM | POA: Diagnosis not present

## 2023-09-09 ENCOUNTER — Encounter: Payer: Self-pay | Admitting: Internal Medicine

## 2023-09-09 ENCOUNTER — Ambulatory Visit (AMBULATORY_SURGERY_CENTER): Admitting: Internal Medicine

## 2023-09-09 VITALS — BP 129/63 | HR 73 | Temp 97.0°F | Resp 16

## 2023-09-09 DIAGNOSIS — Z860101 Personal history of adenomatous and serrated colon polyps: Secondary | ICD-10-CM

## 2023-09-09 DIAGNOSIS — Z1211 Encounter for screening for malignant neoplasm of colon: Secondary | ICD-10-CM

## 2023-09-09 DIAGNOSIS — Z860100 Personal history of colon polyps, unspecified: Secondary | ICD-10-CM

## 2023-09-09 DIAGNOSIS — F419 Anxiety disorder, unspecified: Secondary | ICD-10-CM | POA: Diagnosis not present

## 2023-09-09 DIAGNOSIS — F32A Depression, unspecified: Secondary | ICD-10-CM | POA: Diagnosis not present

## 2023-09-09 DIAGNOSIS — E039 Hypothyroidism, unspecified: Secondary | ICD-10-CM | POA: Diagnosis not present

## 2023-09-09 MED ORDER — SODIUM CHLORIDE 0.9 % IV SOLN: 500.0000 mL | Freq: Once | INTRAVENOUS | Status: DC

## 2023-09-09 NOTE — Progress Notes (Signed)
 Report to PACU, RN, vss, BBS= Clear.

## 2023-09-09 NOTE — Patient Instructions (Addendum)
  Resume previous diet. Continue present medications. Repeat colonoscopy in 5 years for surveillance   YOU HAD AN ENDOSCOPIC PROCEDURE TODAY AT [facility]:   Refer to the procedure report that was given to you for any specific questions about what was found during the examination.  If the procedure report does not answer your questions, please call your gastroenterologist to clarify.  If you requested that your care partner not be given the details of your procedure findings, then the procedure report has been included in a sealed envelope for you to review at your convenience later.  YOU SHOULD EXPECT: Some feelings of bloating in the abdomen. Passage of more gas than usual.  Walking can help get rid of the air that was put into your GI tract during the procedure and reduce the bloating. If you had a lower endoscopy (such as a colonoscopy or flexible sigmoidoscopy) you may notice spotting of blood in your stool or on the toilet paper. If you underwent a bowel prep for your procedure, you may not have a normal bowel movement for a few days.  Please Note:  You might notice some irritation and congestion in your nose or some drainage.  This is from the oxygen used during your procedure.  There is no need for concern and it should clear up in a day or so.  SYMPTOMS TO REPORT IMMEDIATELY:  Following lower endoscopy (colonoscopy or flexible sigmoidoscopy):  Excessive amounts of blood in the stool  Significant tenderness or worsening of abdominal pains  Swelling of the abdomen that is new, acute  Fever of 100F or higher  For urgent or emergent issues, a gastroenterologist can be reached at any hour by calling [phone]. Do not use MyChart messaging for urgent concerns.    DIET:  We do recommend a small meal at first, but then you may proceed to your regular diet.  Drink plenty of fluids but you should avoid alcoholic beverages for 24 hours.  ACTIVITY:  You should plan to  take it easy for the rest of today and you should NOT DRIVE or use heavy machinery until tomorrow (because of the sedation medicines used during the test).    FOLLOW UP: Our staff will call the number listed on your records the next business day following your procedure.  We will call around 7:15- 8:00 am to check on you and address any questions or concerns that you may have regarding the information given to you following your procedure. If we do not reach you, we will leave a message.     If any biopsies were taken you will be contacted by phone or by letter within the next 1-3 weeks.  Please call us at [phone] if you have not heard about the biopsies in 3 weeks.    SIGNATURES/CONFIDENTIALITY: You and/or your care partner have signed paperwork which will be entered into your electronic medical record.  These signatures attest to the fact that that the information above on your After Visit Summary has been reviewed and is understood.  Full responsibility of the confidentiality of this discharge information lies with you and/or your care-partner.

## 2023-09-09 NOTE — Progress Notes (Signed)
 Pt's states no medical or surgical changes since previsit or office visit.

## 2023-09-09 NOTE — Op Note (Signed)
 [facility] Patient Name: Shelby Shaw Procedure Date: 09/09/2023 12:11 PM MRN: [id number] Endoscopist: Murel Arlington. Elvin Hammer , MD, [id number] Age: 68 Referring MD:  Date of Birth: [birth year] Gender: Female Account #: [id number] Procedure:                Colonoscopy Indications:              High risk colon cancer surveillance: Personal                            history of adenoma (10 mm or greater in size), High                            risk colon cancer surveillance: Personal history of                            adenoma with villous component, High risk colon                            cancer surveillance: Personal history of multiple                            (3 or more) adenomas. Previous examinations 2011,                            2016 Medicines:                Monitored Anesthesia Care Procedure:                Pre-Anesthesia Assessment:                           - Prior to the procedure, a History and Physical                            was performed, and patient medications and                            allergies were reviewed. The patient's tolerance of                            previous anesthesia was also reviewed. The risks                            and benefits of the procedure and the sedation                            options and risks were discussed with the patient.                            All questions were answered, and informed consent                            was obtained. Prior Anticoagulants: The patient has  taken no anticoagulant or antiplatelet agents.                            After reviewing the risks and benefits, the patient                            was deemed in satisfactory condition to undergo the                            procedure.                           After obtaining informed consent, the colonoscope                            was passed under direct vision. Throughout the                             procedure, the patient's blood pressure, pulse, and                            oxygen saturations were monitored continuously. The                            CF [id number] #[id number] was introduced through the anus                            and advanced to the the cecum, identified by                            appendiceal orifice and ileocecal valve. The                            ileocecal valve, appendiceal orifice, and rectum                            were photographed. The quality of the bowel                            preparation was excellent. The colonoscopy was                            performed without difficulty. The patient tolerated                            the procedure well. The bowel preparation used was                            SUPREP via split dose instruction. Scope In: 12:32:46 PM Scope Out: 12:46:29 PM Scope Withdrawal Time: 0 hours 9 minutes 44 seconds  Total Procedure Duration: 0 hours 13 minutes 43 seconds  Findings:                 The entire examined colon appeared normal on direct  and retroflexion views. Complications:            No immediate complications. Estimated blood loss:                            None. Estimated Blood Loss:     Estimated blood loss: none. Impression:               - The entire examined colon is normal on direct and                            retroflexion views.                           - No specimens collected. Recommendation:           - Repeat colonoscopy in 5 years for surveillance                            (personal history of multiple advanced adenomatous                            polyps).                           - Patient has a contact number available for                            emergencies. The signs and symptoms of potential                            delayed complications were discussed with the                            patient. Return to normal activities tomorrow.                             Written discharge instructions were provided to the                            patient.                           - Resume previous diet.                           - Continue present medications. Murel Arlington. Elvin Hammer, MD 09/09/2023 12:51:31 PM This report has been signed electronically.

## 2023-09-09 NOTE — Progress Notes (Signed)
 HISTORY OF PRESENT ILLNESS:  Shelby Shaw is a 68 y.o. female with a history of multiple and advanced adenomatous colon polyps.  Presents today for surveillance colonoscopy.  No complaints  REVIEW OF SYSTEMS:  All non-GI ROS negative except for  Past Medical History:  Diagnosis Date   Allergy    Anemia    Anxiety    Depression    Dysrhythmia    "skipping beat" seems better since started Thyroid  medication   Family history of anesthesia complication    mother has PONV this is patients first surgery   GERD (gastroesophageal reflux disease)    Headache(784.0)    migraines   Hypothyroidism     Past Surgical History:  Procedure Laterality Date   COLONOSCOPY  08-16-2009   LAPAROSCOPIC ASSISTED VAGINAL HYSTERECTOMY Bilateral 09/09/2013   Procedure: LAPAROSCOPIC ASSISTED VAGINAL HYSTERECTOMY, Bilateral Salpingo-oophorectomy;  Surgeon: Adrianna Albee, MD;  Location: [facility];  Service: Gynecology;  Laterality: Bilateral;   POLYPECTOMY  08-16-2009   TVA    Social History Shelby Shaw  reports that she has never smoked. She has never used smokeless tobacco. She reports that she does not currently use alcohol . She reports that she does not use drugs.  family history includes Colon polyps in her father.  No Known Allergies     PHYSICAL EXAMINATION: Vital signs: BP 135/89   Pulse 98   Temp (!) 97 F (36.1 C)   LMP 08/29/2013   SpO2 97%  General: Well-developed, well-nourished, no acute distress HEENT: Sclerae are anicteric, conjunctiva pink. Oral mucosa intact Lungs: Clear Heart: Regular Abdomen: soft, nontender, nondistended, no obvious ascites, no peritoneal signs, normal bowel sounds. No organomegaly. Extremities: No edema Psychiatric: alert and oriented x3. Cooperative     ASSESSMENT:  Personal history of multiple advanced adenomatous colon polyps   PLAN: Surveillance colonoscopy

## 2023-09-10 ENCOUNTER — Telehealth: Payer: Self-pay

## 2023-09-10 NOTE — Telephone Encounter (Signed)
 Left message on follow up call.

## 2023-09-17 DIAGNOSIS — N898 Other specified noninflammatory disorders of vagina: Secondary | ICD-10-CM | POA: Diagnosis not present

## 2023-11-12 DIAGNOSIS — M25511 Pain in right shoulder: Secondary | ICD-10-CM | POA: Diagnosis not present

## 2023-11-12 DIAGNOSIS — S139XXA Sprain of joints and ligaments of unspecified parts of neck, initial encounter: Secondary | ICD-10-CM | POA: Diagnosis not present

## 2023-11-12 DIAGNOSIS — M5030 Other cervical disc degeneration, unspecified cervical region: Secondary | ICD-10-CM | POA: Diagnosis not present

## 2023-11-17 DIAGNOSIS — F329 Major depressive disorder, single episode, unspecified: Secondary | ICD-10-CM | POA: Diagnosis not present

## 2023-12-09 DIAGNOSIS — M25552 Pain in left hip: Secondary | ICD-10-CM | POA: Diagnosis not present

## 2023-12-09 DIAGNOSIS — S7002XA Contusion of left hip, initial encounter: Secondary | ICD-10-CM | POA: Diagnosis not present

## 2023-12-12 DIAGNOSIS — S7002XD Contusion of left hip, subsequent encounter: Secondary | ICD-10-CM | POA: Diagnosis not present

## 2023-12-22 DIAGNOSIS — M25552 Pain in left hip: Secondary | ICD-10-CM | POA: Diagnosis not present

## 2024-03-10 DIAGNOSIS — R051 Acute cough: Secondary | ICD-10-CM | POA: Diagnosis not present

## 2024-03-10 DIAGNOSIS — J069 Acute upper respiratory infection, unspecified: Secondary | ICD-10-CM | POA: Diagnosis not present

## 2024-03-16 DIAGNOSIS — M25511 Pain in right shoulder: Secondary | ICD-10-CM | POA: Diagnosis not present

## 2024-03-17 ENCOUNTER — Encounter: Payer: Self-pay | Admitting: Sports Medicine

## 2024-03-17 ENCOUNTER — Other Ambulatory Visit: Payer: Self-pay | Admitting: Sports Medicine

## 2024-03-17 DIAGNOSIS — G8929 Other chronic pain: Secondary | ICD-10-CM

## 2024-03-19 ENCOUNTER — Inpatient Hospital Stay: Admission: RE | Admit: 2024-03-19 | Discharge: 2024-03-19 | Attending: Sports Medicine | Admitting: Sports Medicine

## 2024-03-19 DIAGNOSIS — G8929 Other chronic pain: Secondary | ICD-10-CM
